# Patient Record
Sex: Male | Born: 1955 | Race: White | Hispanic: No | Marital: Married | Smoking: Former smoker
Health system: Southern US, Community
[De-identification: ages and names within clinical notes are randomized; demographics above are authoritative.]

## PROBLEM LIST (undated history)

## (undated) DIAGNOSIS — R972 Elevated prostate specific antigen [PSA]: Secondary | ICD-10-CM

## (undated) DIAGNOSIS — M1990 Unspecified osteoarthritis, unspecified site: Secondary | ICD-10-CM

## (undated) DIAGNOSIS — I10 Essential (primary) hypertension: Secondary | ICD-10-CM

## (undated) DIAGNOSIS — T8859XA Other complications of anesthesia, initial encounter: Secondary | ICD-10-CM

## (undated) DIAGNOSIS — T4145XA Adverse effect of unspecified anesthetic, initial encounter: Secondary | ICD-10-CM

## (undated) DIAGNOSIS — Q2112 Patent foramen ovale: Secondary | ICD-10-CM

## (undated) DIAGNOSIS — R011 Cardiac murmur, unspecified: Secondary | ICD-10-CM

## (undated) DIAGNOSIS — N400 Enlarged prostate without lower urinary tract symptoms: Secondary | ICD-10-CM

## (undated) DIAGNOSIS — M109 Gout, unspecified: Secondary | ICD-10-CM

## (undated) DIAGNOSIS — I253 Aneurysm of heart: Secondary | ICD-10-CM

## (undated) DIAGNOSIS — E669 Obesity, unspecified: Secondary | ICD-10-CM

## (undated) DIAGNOSIS — Q211 Atrial septal defect: Secondary | ICD-10-CM

## (undated) HISTORY — DX: Benign prostatic hyperplasia without lower urinary tract symptoms: R97.20

## (undated) HISTORY — PX: JOINT REPLACEMENT: SHX530

## (undated) HISTORY — DX: Essential (primary) hypertension: I10

## (undated) HISTORY — DX: Unspecified osteoarthritis, unspecified site: M19.90

---

## 2006-06-26 HISTORY — PX: TOTAL HIP ARTHROPLASTY: SHX124

## 2007-02-08 ENCOUNTER — Encounter (INDEPENDENT_AMBULATORY_CARE_PROVIDER_SITE_OTHER): Payer: Self-pay | Admitting: Cardiology

## 2007-02-08 ENCOUNTER — Ambulatory Visit (HOSPITAL_COMMUNITY): Admission: RE | Admit: 2007-02-08 | Discharge: 2007-02-08 | Payer: Self-pay | Admitting: Cardiology

## 2007-04-03 ENCOUNTER — Inpatient Hospital Stay (HOSPITAL_COMMUNITY): Admission: RE | Admit: 2007-04-03 | Discharge: 2007-04-07 | Payer: Self-pay | Admitting: Orthopedic Surgery

## 2007-05-15 ENCOUNTER — Encounter: Payer: Self-pay | Admitting: Orthopedic Surgery

## 2007-05-27 ENCOUNTER — Encounter: Payer: Self-pay | Admitting: Orthopedic Surgery

## 2008-06-26 HISTORY — PX: TOTAL HIP ARTHROPLASTY: SHX124

## 2008-08-31 ENCOUNTER — Inpatient Hospital Stay (HOSPITAL_COMMUNITY): Admission: RE | Admit: 2008-08-31 | Discharge: 2008-09-03 | Payer: Self-pay | Admitting: Orthopedic Surgery

## 2010-10-06 LAB — URINALYSIS, ROUTINE W REFLEX MICROSCOPIC
Bilirubin Urine: NEGATIVE
Glucose, UA: NEGATIVE mg/dL
Hgb urine dipstick: NEGATIVE
Ketones, ur: NEGATIVE mg/dL
Nitrite: NEGATIVE
Specific Gravity, Urine: 1.016 (ref 1.005–1.030)
Urobilinogen, UA: 0.2 mg/dL (ref 0.0–1.0)

## 2010-10-06 LAB — COMPREHENSIVE METABOLIC PANEL
ALT: 118 U/L — ABNORMAL HIGH (ref 0–53)
AST: 56 U/L — ABNORMAL HIGH (ref 0–37)
Albumin: 3.9 g/dL (ref 3.5–5.2)
BUN: 15 mg/dL (ref 6–23)
CO2: 27 mEq/L (ref 19–32)
Calcium: 9.6 mg/dL (ref 8.4–10.5)
Creatinine, Ser: 1.11 mg/dL (ref 0.4–1.5)
GFR calc Af Amer: >60 mL/min (ref >60)
GFR calc non Af Amer: >60 mL/min (ref >60)
Potassium: 3.9 mEq/L (ref 3.5–5.1)
Sodium: 138 mEq/L (ref 135–145)
Total Bilirubin: 0.8 mg/dL (ref 0.3–1.2)

## 2010-10-06 LAB — BASIC METABOLIC PANEL
CO2: 32 mEq/L (ref 19–32)
Calcium: 8.7 mg/dL (ref 8.4–10.5)
Chloride: 103 mEq/L (ref 96–112)
Chloride: 104 mEq/L (ref 96–112)
GFR calc Af Amer: >60 mL/min (ref >60)
GFR calc non Af Amer: >60 mL/min (ref >60)
Potassium: 4.8 mEq/L (ref 3.5–5.1)
Sodium: 137 mEq/L (ref 135–145)

## 2010-10-06 LAB — CBC
HCT: 37.2 % — ABNORMAL LOW (ref 39.0–52.0)
Hemoglobin: 12.5 g/dL — ABNORMAL LOW (ref 13.0–17.0)
Hemoglobin: 12.8 g/dL — ABNORMAL LOW (ref 13.0–17.0)
MCHC: 34.7 g/dL (ref 30.0–36.0)
MCV: 92.4 fL (ref 78.0–100.0)
MCV: 92.4 fL (ref 78.0–100.0)
Platelets: 122 10*3/uL — ABNORMAL LOW (ref 150–400)
Platelets: 124 10*3/uL — ABNORMAL LOW (ref 150–400)
RBC: 3.76 MIL/uL — ABNORMAL LOW (ref 4.22–5.81)
RBC: 4.87 MIL/uL (ref 4.22–5.81)
RDW: 13.1 % (ref 11.5–15.5)
RDW: 13.2 % (ref 11.5–15.5)
WBC: 7.7 10*3/uL (ref 4.0–10.5)

## 2010-10-06 LAB — TYPE AND SCREEN: Antibody Screen: NEGATIVE

## 2010-10-06 LAB — PROTIME-INR
INR: 1.6 — ABNORMAL HIGH (ref 0.00–1.49)
INR: 1.7 — ABNORMAL HIGH (ref 0.00–1.49)
Prothrombin Time: 15.3 seconds — ABNORMAL HIGH (ref 11.6–15.2)

## 2010-10-06 LAB — APTT: aPTT: 32 seconds (ref 24–37)

## 2010-11-08 NOTE — Op Note (Signed)
[name], Cunningham NO.:  [id number]   MEDICAL RECORD NO.:  [id number]          PATIENT TYPE:  INP   LOCATION:  1540                         FACILITY:  [facility]   PHYSICIAN:  Ollen Gross, M.D.    DATE OF BIRTH:  08/08/1955   DATE OF PROCEDURE:  04/03/2007  DATE OF DISCHARGE:                               OPERATIVE REPORT   PREOPERATIVE DIAGNOSIS:  Osteoarthritis left hip.   POSTOPERATIVE DIAGNOSIS:  Osteoarthritis left hip.   PROCEDURE:  Left total hip arthroplasty.   SURGEON:  Ollen Gross, M.D.   ASSISTANT:  Avel Peace PA-C   ANESTHESIA:  General.   ESTIMATED BLOOD LOSS:  500 mL.   DRAINS:  None.   COMPLICATIONS:  None.   CONDITION:  Stable to recovery.   BRIEF CLINICAL NOTE:  [name] is a 55 year old male with severe end-  stage arthritis of the left hip with progressively worsening pain and  dysfunction.  He has failed nonoperative management and presents now for  left total hip arthroplasty.   PROCEDURE IN DETAIL:  After successful administration of general  anesthetic, the patient is placed in the right lateral decubitus  position with the left side up and held with the hip positioner.  Left  lower extremity isolated from his perineum with plastic drapes and  prepped and draped in usual sterile fashion.  Short posterolateral  incision is made with a 10 blade through subcutaneous tissue to the  level of the fascia lata which was incised in line with the skin  incision.  The sciatic nerve was palpated and protected and short  external rotators isolated off the femur.  Capsulectomy is performed and  the hip was dislocated.  The center of femoral head is marked and a  trial prosthesis placed such that such that the center of the trial head  corresponds to the center of his native femoral head.  Osteotomy line  was  marked on the femoral neck and osteotomy was made with an  oscillating saw.  Femoral head removed and then femur was  retracted  anteriorly to gain acetabular exposure.   The acetabular retractors were placed and labrum and osteophytes  removed.  Reaming starts at 45 mm coursing increments of 2 to 55 and we  placed a 56 mm pinnacle acetabular shell in anatomic position and  transfixed with two dome screws with excellent purchase.  The apex hole  eliminator is placed and then the 40 mm neutral Ultamet metal liner is  placed for metal-on-metal hip replacement.   The femur was prepared with canal finder and irrigation.  Axial reaming  is performed to 13.5 mm and proximal reaming to 18D and the sleeve  machined to a large.  A 1 D large trial sleeve is placed with 18 x 13  stem and 36 plus 8 neck.  His native anteversion was neutral so I  anteverted the stem 20 degrees.  The 40.0 trial head is placed and the  hip is reduced with great stability.  There was full extension, full  external rotation, 70 degrees flexion, 40 degrees abduction and  90  degrees internal rotation and 90 degrees of flexion and 70 degrees of  internal rotation.  By placing the left leg on top of the right, it felt  as though leg lengths were equal.  The hip was then dislocated and  trials are removed.  The permanent 18D large sleeve is placed with 18 x  13 stem, 36 plus 8 neck 20 degrees beyond native anteversion.  The 40  plus 0 head is placed and the hip is reduced with the same stability  parameters.  Wound was copiously irrigated with saline solution and  short rotators reattached to the femur through drill holes.  Fascia lata  was closed with interrupted #1 Vicryl, subcu closed with #1-0 and #2-0  Vicryl and subcuticular running 4-0 Monocryl.  Incisions cleaned and  dried and Steri-Strips and bulky sterile dressing applied.  The left  lower extremity was placed into knee immobilizer.  He was awakened and  transferred to recovery in stable condition.  April 03, 2007      Ollen Gross, M.D.  Electronically Signed      FA/MEDQ  D:  04/03/2007  T:  04/04/2007  Job:  [id number]

## 2010-11-08 NOTE — Discharge Summary (Signed)
[name], [name] NO.:  [id number]   MEDICAL RECORD NO.:  [id number]          PATIENT TYPE:  INP   LOCATION:  1605                         FACILITY:  [facility]   PHYSICIAN:  [name], M.D.    DATE OF BIRTH:  1955-08-08   DATE OF ADMISSION:  08/31/2008  DATE OF DISCHARGE:  09/03/2008                               DISCHARGE SUMMARY   ADMITTING DIAGNOSES:  1. Osteoarthritis, right hip.  2. Hypercholesterolemia.  3. Past history of renal insufficiency secondary to nonsteroidal anti-      inflammatory drugs.  4. Past history of elevated liver function tests secondary to      nonsteroidal anti-inflammatory drugs.  5. History of gout.   DISCHARGE DIAGNOSES:  1. Osteoarthritis, right hip, status post right total replacement      arthroplasty.  2. Hypercholesterolemia.  3. Past history of renal insufficiency secondary to nonsteroidal anti-      inflammatory drugs.  4. Past history of elevated liver function tests secondary to      nonsteroidal anti-inflammatory drugs.  5. History of gout.   PROCEDURE:  August 31, 2008 - right total hip.  Surgeon - Dr. Lequita Cunningham.  Assistant - James Alar, PA-C.  Surgery done under general anesthesia.   CONSULTS:  [facility].   BRIEF HISTORY:  [name] is a 55 year old male with end-stage  arthritis of the right hip, progressive worsening pain and dysfunction,  recent left total hip who now presents for a right total hip.   LABORATORY DATA:  Preoperative CBC showed a hemoglobin of 15.5,  hematocrit of 44.3, white cell count 5.3, platelets 133.  PT/INR 13 and  1 respectively with a PTT of 32.  Chem panel within normal limits with  the exception of mildly elevated SGOT of 56 and elevated SGPT of 118.  Alkaline phosphatase was  normal at 78.  A known prior history of  elevated liver function tests.  Preoperative UA was negative.  Serial  CBCs were followed throughout the hospital course.  Hemoglobin  dropped  down to 12.8 and then 12.5.  The last noted hemoglobin and hematocrit  were 12.2 and 34.5.  Serial pro times followed per Coumadin protocol.  Last noted PT/INR of 21.3 and 1.7.  Serial BMETs were followed.  Follow  up BMETs all stayed within normal limits.   X-RAYS:  Right hip film on August 25, 2008.  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   HOSPITAL COURSE:  The patient was admitted to [facility] and  taken to the OR.  Underwent the above-stated procedure without  complications.  The patient tolerated the procedure well and was later  transferred to the recovery room and orthopedic floor.  Given 24 hours  of postoperative IV antibiotics.  Started on PCA and p.o. analgesic pain  control following surgery.  Was seen postoperatively by [facility].  His Zocor was put on hold because  of the elevated liver function test.  He was given Coumadin for DVT  prophylaxis.  He was doing well  on the evening of surgery.   On the morning of day one, he started to have therapy with partial  weightbearing of 25-50%.  Hemoglobin stable.  He had a history of renal  insufficiency, but his BUN and creatinine were normal preoperatively,  and also looked good postoperatively and remained stable.  He had  excellent urinary output.  He started getting up out of bed on day one  and did very well.  He walked over 200 feet.   By day two, he was doing well.  Pain was under good control.  Dressing  changed; incision looked good.  Hemoglobin was stable.  He was  tolerating his medications.   He continued to progress well, and by the morning of day three, he was  seen on rounds by Dr. Lequita Cunningham, tolerating his medications, meeting his  goals and was discharged home.   DISCHARGE/PLAN:  1. The patient was discharged home on September 03, 2008.  2. Discharge diagnoses - please see above.  3. Discharge medications - he is given Percocet,  Robaxin and Coumadin.   FOLLOWUP:  Follow up in 2 weeks.  Follow up on either Tuesday, September 15, 2008 or Thursday, September 17, 2008.  Call for an appointment.  Diet - low  cholesterol diet.   ACTIVITY:  He is partial weightbearing 25-50%.  Hip precautions, total  hip protocol.  Home health PT and home health nursing.   DISPOSITION:  Home.   CONDITION ON DISCHARGE:  Improving.      [name], P.A.C.      [name], M.D.  Electronically Signed    ALP/MEDQ  D:  09/03/2008  T:  09/03/2008  Job:  [id number]   cc:   [name], M.D.  Fax: [phone]

## 2010-11-08 NOTE — Op Note (Signed)
NAMEMACEN, JOSLIN NO.:  [id number]   MEDICAL RECORD NO.:  [id number]          PATIENT TYPE:  INP   LOCATION:  0001                         FACILITY:  [facility]   PHYSICIAN:  Ollen Gross, M.D.    DATE OF BIRTH:  [birth year]   DATE OF PROCEDURE:  08/31/2008  DATE OF DISCHARGE:                               OPERATIVE REPORT   PREOPERATIVE DIAGNOSIS:  Osteoarthritis right hip.   POSTOPERATIVE DIAGNOSIS:  Osteoarthritis right hip.   PROCEDURE:  Right total hip arthroplasty.   SURGEON:  Ollen Gross, M.D.   ASSISTANT:  Arlyn Leak, PA-C   ANESTHESIA:  General.   ESTIMATED BLOOD LOSS:  200 mL.   DRAINS:  Hemovac x1.   COMPLICATIONS:  None.   CONDITION:  Stable to recovery.   CLINICAL NOTE:  Mr. Capell is a 55 year old male with end-stage  arthritis of the right hip with progressively worsening pain and  dysfunction.  He had a recent successful left total hip arthroplasty  presents now for right total hip arthroplasty.   PROCEDURE IN DETAIL:  After successful administration of general  anesthetic, the patient was placed in left lateral decubitus position  with the right side up and held with the hip positioner.  Right lower  extremity was isolated from his perineum with plastic drapes and prepped  and draped in the usual sterile fashion.  Short posterolateral incision  is made with a 10 blade through subcutaneous tissue to the level of  fascia lata was incised in line with the skin incision.  Sciatic nerve  was palpated and protected and short external rotators isolated off the  femur.  Capsulectomy is performed and the hip was dislocated.  Center of  femoral head is marked and a trial prosthesis was placed such that the  center of the trial head corresponds to the center of his native femoral  head.  Osteotomy line is marked on the femoral neck and osteotomy made  with an oscillating saw.  The femoral head is removed and femur was  retracted anteriorly  to gain acetabular exposure.   Acetabular retractors were placed in the labrum and osteophytes removed.  Acetabular reaming begins at 47 mm coursing in increments of 2 to 55 mm  and a 56 mm Pinnacle acetabular shell is placed in anatomic position and  transfixed with 2 dome screws.  The apex hole eliminator was then placed  and a 40 mm neutral Ultamet metal liner was placed for the metal-on-  metal hip replacement.   The femur was repaired with canal finder and irrigation.  Axial reaming  is performed to 13.5 mm, proximal reaming to an 18 D and the sleeve  machined to a small.  A 18 D small sleeve is placed with 80 x 13 stem  and 36 +8 neck matching native anteversion.  The 40.0 head is placed and  it reduces too easily so I went to 40 +3 which had much better soft  tissue tension.  There is fantastic stability with full extension, full  external rotation, 70 degrees flexion, 40 degrees adduction and 90  degrees  internal rotation, 90 degrees of flexion and 70 degrees of  internal rotation.  By placing the right leg on top of the left, I felt  as though the leg lengths were equal.  The hip was then dislocated and  trials removed.  The permanent 18 D small sleeve is placed and 18 x 13  stem and 36 +8 matching native anteversion.  Permanent 40 +3 head is  placed and the hip is reduced in the same stability parameters.  Wounds  copiously irrigated with saline solution.  Short rotators reattached to  the femur through drill holes.  Fascia lata was closed over Hemovac  drain with interrupted #1 Vicryl.  Subcu closed with #1-0 and #2-0  Vicryl and subcuticular running 4-0 Monocryl.  Incisions cleaned and  dried and Steri-Strips and bulky sterile dressing applied.  Drains  hooked to suction and he is placed into a knee immobilizer, awakened and  transferred to recovery in stable condition.      Ollen Gross, M.D.  Electronically Signed     FA/MEDQ  D:  08/31/2008  T:  08/31/2008  Job:   [id number]

## 2010-11-08 NOTE — H&P (Signed)
[name], KUSTRA NO.:  [id number]   MEDICAL RECORD NO.:  [id number]          PATIENT TYPE:  INP   LOCATION:  NA                           FACILITY:  [facility]   PHYSICIAN:  Ollen Gross, M.D.    DATE OF BIRTH:  1955-09-01   DATE OF ADMISSION:  08/31/2008  DATE OF DISCHARGE:                              HISTORY & PHYSICAL   DATE OF OFFICE VISIT HISTORY AND PHYSICAL:  August 18, 2008.   CHIEF COMPLAINT:  Right hip pain.   HISTORY OF PRESENT ILLNESS:  The patient is a 56 year old male who gas  been seen by Dr. Lequita Halt, well known to him, previously had undergone a  left total hip back in 2008.  Unfortunately, the right hip continues to  be progressively getting worse.  He is known to have severe end-stage  arthritis in that right hip, bone-on-bone.  It is felt he would benefit  undergoing surgical intervention.  Risks and benefits have been  discussed, he elects to proceed with surgery.   ALLERGIES:  No known drug allergies.   CURRENT MEDICATIONS:  Simvastatin, low-dose aspirin, multivitamin,  Vitamin D, hydrocodone.   PAST MEDICAL HISTORY:  1. Hypercholesterolemia.  2. Past history of renal insufficiency secondary to NSAIDs.  3. Past history of elevated liver function test secondary to NSAIDs.  4. History of gout.   PAST SURGICAL HISTORY:  Left total hip 2008.   FAMILY HISTORY:  Father deceased age 59 with lung cancer.  Mother  living, age 69, with Alzheimer's.  Sister deceased, age 45, secondary  breast cancer.   SOCIAL HISTORY:  Married, works as a Geneticist, molecular.  Past smoker  about a half-pack per day for 20 years, no alcohol.  One child.  Lives  with wife.  Does have a caregiver lined up.  Has 6 steps entering his  home.   REVIEW OF SYSTEMS:  GENERAL:  No fevers, chills or night sweats.  NEURO:  No seizures, syncope, paralysis.  RESPIRATORY:  No shortness of breath,  productive cough, hemoptysis.  CARDIOVASCULAR:  No chest pain, angina,  orthopnea.  GI: No nausea, vomiting, diarrhea or constipation.  GU:  No  dysuria or hematuria.  MUSCULOSKELETAL:  Hip pain right hip.   PHYSICAL EXAMINATION:  VITAL SIGNS:  Pulse 72, respirations 14, blood  pressure 136/88.  GENERAL:  A 55 year old white male, well-nourished, well-developed, no  acute distress, alert, oriented, cooperative.  HEENT:  Normocephalic, atraumatic.  Pupils are round, reactive.  EOMs  intact.  NECK:  Supple.  CHEST:  Clear.  HEART:  Regular rate and rhythm.  No murmur.  S1-S2 noted.  ABDOMEN:  Soft, nontender.  Bowel sounds present.  Slightly round.  RECTAL/BREASTS/GENITALIA:  Not done, not pertinent to present illness.  EXTREMITIES:  Right hip flexion 90, zero internal rotation, zero  external rotation, about 20 degrees of abduction.   IMPRESSION:  Osteoarthritis right hip.   PLAN:  The patient admitted to [facility].  Will undergo a  right total hip.  The surgery will be performed by Ollen Gross.      Alexzandrew L.  Perkins, P.A.C.      Ollen Gross, M.D.  Electronically Signed    ALP/MEDQ  D:  08/27/2008  T:  08/27/2008  Job:  [id number]   cc:   Brett Canales A. Cleta Alberts, M.D.  Fax: [phone]   Ollen Gross, M.D.  Fax: [phone]

## 2010-11-11 NOTE — H&P (Signed)
[name], [name] NO.:  [id number]   MEDICAL RECORD NO.:  [id number]          PATIENT TYPE:  INP   LOCATION:  1540                         FACILITY:  [facility]   PHYSICIAN:  Ollen Gross, M.D.    DATE OF BIRTH:  1956/06/07   DATE OF ADMISSION:  04/03/2007  DATE OF DISCHARGE:  04/07/2007                              HISTORY & PHYSICAL   CHIEF COMPLAINT:  Left hip pain.   HISTORY OF PRESENT ILLNESS:  The patient is a 55 year old male who has  been seen by Dr. Lequita Halt for ongoing left hip pain.  He was noted to  have end-stage arthritis of both hips but continues to have discomfort.  The left is more problematic. Pain is getting much worse and has reached  the point where it is felt he would benefit undergoing surgical  intervention.  Risks and benefits have been discussed.  He elects to  proceed with surgery.   ALLERGIES:  No known drug allergies.   CURRENT MEDICATIONS:  Tricor, Crestor, Vicodin, aspirin, Centrum  vitamin.   PAST MEDICAL HISTORY:  1. Gout.  2. Hypercholesterolemia.  3. History of renal insufficiency.  4. History of elevated liver enzymes.  5. Right bundle branch block per EKG.   PAST SURGICAL HISTORY:  Is negative.   SOCIAL HISTORY:  Married.  He works as a Geneticist, molecular. Nonsmoker,  occasional intake of alcohol.  One child.  Wife will be assisting with  care after surgery.   FAMILY HISTORY:  Father with history of hypertension, heart disease and  a pacemaker. Grandfather with history of stroke, uncle with colon  cancer, sister with breast cancer.   REVIEW OF SYSTEMS:  GENERAL:  No fevers, chills, night sweats.  NEUROLOGIC: Seizures, syncope, or paralysis.  RESPIRATORY:  No shortness  of breath, productive cough or hemoptysis.  CARDIOVASCULAR:  No chest  pain, angina, orthopnea.  GI:  No nausea, diarrhea or constipation.  GU:  No dysuria, hematuria or discharge.  MUSCULOSKELETAL: Left hip.   PHYSICAL EXAMINATION:  VITAL SIGNS:   Pulse 64, respirations 12, blood  pressure 118/82.  GENERAL:  A 55 year old white male, well-nourished, well-developed, no  acute distress, alert, oriented, and cooperative.  Good historian.  Mildly anxious.  HEENT:  Normocephalic, atraumatic.  Pupils are round and reactive.  Oropharynx clear.  EOMs intact.  NECK:  Supple.  CHEST:  Clear.  HEART:  Regular rate and rhythm.  No murmur. S1-S2 noted.  ABDOMEN:  Soft, nontender.  Bowel sounds present.  RECTAL, BREASTS, GENITALIA:  Not done, not pertinent to present illness.  EXTREMITIES:  Left hip: Flexion 90-0, internal rotation 20 degrees,  external rotation 20 degrees abduction.   IMPRESSION:  1. Osteoarthritis, left hip.  2. History of gout.  3. Hypercholesterolemia.  4. History of renal insufficiency.  5. History of elevated liver enzymes.  6. Right bundle branch block per electrocardiogram.   PLAN:  The patient will be admitted to [facility] and undergo  a left total hip replacement arthroplasty.  Surgery will be performed by  Dr. Ollen Gross.      Alexzandrew  Tessie Fass, P.A.C.      Ollen Gross, M.D.  Electronically Signed    ALP/MEDQ  D:  04/16/2007  T:  04/16/2007  Job:  [id number]

## 2010-11-11 NOTE — Discharge Summary (Signed)
[name], [name]               ACCOUNT NO.:  [id number]   MEDICAL RECORD NO.:  [id number]          PATIENT TYPE:  INP   LOCATION:  1540                         [facility]ACILITY:  [facility]   PHYSICIAN:  Ollen Gross, M.D.    DATE OF BIRTH:  10-Oct-1955   DATE OF ADMISSION:  04/03/2007  DATE OF DISCHARGE:  04/07/2007                               DISCHARGE SUMMARY   ADMISSION DIAGNOSES:  1. Osteoarthritis left hip.  2. History of gout.  3. Hypercholesterolemia.  4. History of renal insufficiency.  5. History of elevated liver enzymes.  6. Right bundle branch block per electrocardiogram.   DISCHARGE DIAGNOSES:  1. Osteoarthritis left hip, status post left total hip arthroplasty.  2. Mild postop blood loss anemia.  3. History of gout.  4. Hypercholesterolemia.  5. History of renal insufficiency.  6. History of elevated liver enzymes  7. Right bundle branch block per electrocardiogram.   PROCEDURE:  April 03, 2007:  Left total hip.  Surgeon:  Dr. Lequita Halt  assisted by [name].   CONSULTATIONS:  None.   BRIEF HISTORY:  [name] is a 55 year old male with end-stage  arthritis of the left hip, progressive worsening pain and dysfunction,  had been on pain management, but  now presents for total hip.   LABORATORY DATA:  Preop CBC showed hemoglobin 13.7, hematocrit 39.0,  white cell count 4.5.  Postop hemoglobin 11.5 drifting down to 10.7.  Last H&H 10.8 and 30.3.  PT/PTT preop 13.3 and 31 respectively.  INR  1.0.  Serial pro-times followed.  PT/INR 23.2 and 2.0.  Chem panel on  admission all within normal limits.  Serial BMETs were followed.  Electrolytes remained within normal limits in the postop period.  Preop  UA negative.  Blood group type O+.   DIAGNOSTICS:  1. Left hip films preop March 28, 2007:  Advanced bilateral      degenerative changes both hips, more so on the left.  2. Portable pelvis/hip films:  Left hip arthroplasty without immediate      complicating  features.   HOSPITAL COURSE:  The patient was admitted to [facility] and  tolerated the procedure well, later transferred to the recovery room on  the orthopedic floor.  Started on PCA and p.o. analgesic for pain  control following surgery.  Given 24 hours postop IV antibiotics.  Initially doing pretty well.  On morning of day 1, he did have some  increased pain on the evening of surgery.  He was doing a little better  on day 1.  Started getting up out of bed. We discontinued the knee  immobilizer.  By day 2, he was still a little sore, but pain was under a  little bit better control.  Hemoglobin was down to 10.7.  He was  asymptomatic with this.  Dressing was changed.  Incision looked good.  Started getting up with therapy.  He was partial weightbearing by day 3.  He had a little bit elevated temperature on the evening day 2 which was  better by day 3.  He started getting up doing more mobility,  but not  quite completely independent yet.  Therefore, he was kept another day  for therapy until April 07, 2007.  He was doing well, tolerating meds  and discharged home.   DISCHARGE/PLAN:  The patient is discharged home on April 07, 2007.   DISCHARGE MEDICATIONS:  1. Vicodin.  2. Robaxin.  3. Coumadin.   DIET:  As tolerated.   FOLLOW UP:  Follow up in 2 weeks.   ACTIVITY:  Partial weightbearing left lower extremity 25-50%.  Home  health PT and home health nursing, hip precautions total hip protocol.   DISPOSITION:  Home.   CONDITION ON DISCHARGE:  Improving.      [name], P.A.C.      Ollen Gross, M.D.  Electronically Signed    ALP/MEDQ  D:  05/22/2007  T:  05/22/2007  Job:  [id number]   cc:   Ollen Gross, M.D.  Fax: [phone]

## 2011-04-06 LAB — ABO/RH: ABO/RH(D): O POS

## 2011-04-06 LAB — BASIC METABOLIC PANEL
BUN: 10
BUN: 7
CO2: 29
CO2: 30
Calcium: 8.7
Chloride: 106
Creatinine, Ser: 1.43
GFR calc Af Amer: >60
GFR calc non Af Amer: 59 — ABNORMAL LOW
Glucose, Bld: 127 — ABNORMAL HIGH
Glucose, Bld: 130 — ABNORMAL HIGH
Potassium: 4
Sodium: 137

## 2011-04-06 LAB — CBC
HCT: 32.3 — ABNORMAL LOW
HCT: 39
Hemoglobin: 10.8 — ABNORMAL LOW
Hemoglobin: 11.5 — ABNORMAL LOW
MCHC: 35.1
MCHC: 35.6
MCHC: 35.7
MCHC: 35.7
MCV: 91.4
Platelets: 149 — ABNORMAL LOW
Platelets: 156
Platelets: 167
RBC: 4.25
RDW: 11.9
RDW: 12.1
RDW: 12.3
RDW: 12.4
WBC: 4.5

## 2011-04-06 LAB — COMPREHENSIVE METABOLIC PANEL
ALT: 37
CO2: 25
Calcium: 9.4
Chloride: 104
Creatinine, Ser: 1.41
GFR calc Af Amer: >60
Glucose, Bld: 101 — ABNORMAL HIGH
Potassium: 3.5
Sodium: 137
Total Bilirubin: 0.9

## 2011-04-06 LAB — PROTIME-INR
INR: 1
INR: 1.6 — ABNORMAL HIGH
INR: 1.9 — ABNORMAL HIGH
Prothrombin Time: 13.3
Prothrombin Time: 19.6 — ABNORMAL HIGH
Prothrombin Time: 23.2 — ABNORMAL HIGH

## 2011-04-06 LAB — URINALYSIS, ROUTINE W REFLEX MICROSCOPIC
Glucose, UA: NEGATIVE
Urobilinogen, UA: 0.2
pH: 5.5

## 2011-04-06 LAB — CROSSMATCH: ABO/RH(D): O POS

## 2011-08-03 ENCOUNTER — Ambulatory Visit: Payer: Self-pay | Admitting: General Practice

## 2011-08-30 ENCOUNTER — Telehealth: Payer: Self-pay

## 2011-08-30 NOTE — Telephone Encounter (Signed)
WIFE NOTIFIED AND RX PHONED INTO [facility]

## 2011-08-30 NOTE — Telephone Encounter (Signed)
Ok to call in norco 325/5 1 q4 for pain #30 1 refill

## 2011-08-30 NOTE — Telephone Encounter (Signed)
Dr Cleta Alberts, pts chart is in your box

## 2011-08-30 NOTE — Telephone Encounter (Signed)
PATIENT IS NEEDING HIS VICODIN REFILLED.  ACCORDING TO HIS WIFE, THE PHARMACY SAID IT WAS DENIED.  SHE WOULD LIKE FOR DOCTOR DAUB TO LOOK AT THE CHART AND PLEASE REFILL THIS.

## 2011-10-17 ENCOUNTER — Encounter: Payer: Self-pay | Admitting: Emergency Medicine

## 2011-10-17 ENCOUNTER — Ambulatory Visit (INDEPENDENT_AMBULATORY_CARE_PROVIDER_SITE_OTHER): Payer: PRIVATE HEALTH INSURANCE | Admitting: Emergency Medicine

## 2011-10-17 VITALS — BP 140/89 | HR 57 | Temp 97.7°F | Resp 16 | Ht 68.0 in | Wt 214.6 lb

## 2011-10-17 DIAGNOSIS — Z0000 Encounter for general adult medical examination without abnormal findings: Secondary | ICD-10-CM

## 2011-10-17 DIAGNOSIS — N529 Male erectile dysfunction, unspecified: Secondary | ICD-10-CM

## 2011-10-17 DIAGNOSIS — M25559 Pain in unspecified hip: Secondary | ICD-10-CM

## 2011-10-17 LAB — LIPID PANEL
Cholesterol: 175 mg/dL (ref 0–200)
HDL: 32 mg/dL — ABNORMAL LOW (ref >39)
Total CHOL/HDL Ratio: 5.5 Ratio
VLDL: 34 mg/dL (ref 0–40)

## 2011-10-17 LAB — CBC WITH DIFFERENTIAL/PLATELET
Basophils Relative: 1 % (ref 0–1)
Eosinophils Absolute: 0.2 10*3/uL (ref 0.0–0.7)
Eosinophils Relative: 3 % (ref 0–5)
Lymphs Abs: 1.8 10*3/uL (ref 0.7–4.0)
MCH: 30.8 pg (ref 26.0–34.0)
MCHC: 35 g/dL (ref 30.0–36.0)
MCV: 88 fL (ref 78.0–100.0)
Monocytes Relative: 8 % (ref 3–12)
Neutrophils Relative %: 60 % (ref 43–77)
Platelets: 166 10*3/uL (ref 150–400)
RBC: 5.09 MIL/uL (ref 4.22–5.81)

## 2011-10-17 LAB — POCT URINALYSIS DIPSTICK
Leukocytes, UA: NEGATIVE
Nitrite, UA: NEGATIVE
Protein, UA: NEGATIVE
Urobilinogen, UA: 0.2
pH, UA: 5.5

## 2011-10-17 LAB — POCT UA - MICROSCOPIC ONLY
Casts, Ur, LPF, POC: NEGATIVE
Crystals, Ur, HPF, POC: NEGATIVE
Yeast, UA: NEGATIVE

## 2011-10-17 LAB — COMPREHENSIVE METABOLIC PANEL
Alkaline Phosphatase: 75 U/L (ref 39–117)
CO2: 25 mEq/L (ref 19–32)
Creat: 1.19 mg/dL (ref 0.50–1.35)
Glucose, Bld: 84 mg/dL (ref 70–99)
Total Bilirubin: 0.6 mg/dL (ref 0.3–1.2)

## 2011-10-17 LAB — TSH: TSH: 0.95 u[IU]/mL (ref 0.350–4.500)

## 2011-10-17 NOTE — Progress Notes (Signed)
  Subjective:    Patient ID: [name], male    DOB: 04/28/1956, 56 y.o.   MRN: [id number]  HPI patient here for yearly physical exam. He is doing very well recently. He now has a job at [employer] in the [employer]. He is hesitant to take medications. He rarely takes a Vicodin at night for pain he occasionally takes Viagra for his ADD. He would rather work on his blood pressure problem list diet and exercise    Review of Systems  Constitutional: Negative.   HENT: Negative.   Eyes: Negative.   Respiratory: Negative.   Cardiovascular: Negative.   Gastrointestinal: Negative.   Genitourinary:       He does have some issues with ED but only takes about a quarter tablet when he takes medication.  Musculoskeletal:       He's done very well since his hip replacement and is very happy proceed with that  Skin: Negative.   Neurological: Negative.   Hematological: Negative.   Psychiatric/Behavioral: Negative.        Objective:   Physical Exam  Constitutional: He appears well-developed.  HENT:  Head: Normocephalic and atraumatic.  Eyes: Pupils are equal, round, and reactive to light.  Neck: No tracheal deviation present. No thyromegaly present.  Cardiovascular: Normal rate, regular rhythm, normal heart sounds and intact distal pulses.  Exam reveals no gallop and no friction rub.   No murmur heard. Pulmonary/Chest: No respiratory distress. He has no wheezes. He has no rales. He exhibits no tenderness.  Abdominal: He exhibits no distension. There is no tenderness. There is no rebound and no guarding.  Genitourinary: Prostate normal.  Musculoskeletal: Normal range of motion.  Neurological: He is alert. No cranial nerve deficit. Coordination normal.  Skin: Skin is warm and dry.  Psychiatric: He has a normal mood and affect. His behavior is normal.    EKG RBBB      Assessment & Plan:  The blood pressure is borderline elevated at 140/89. He would prefer to treat  this with Salt restriction and increased exercise.

## 2011-10-18 ENCOUNTER — Encounter: Payer: Self-pay | Admitting: Family Medicine

## 2012-01-16 ENCOUNTER — Telehealth: Payer: Self-pay

## 2012-01-16 NOTE — Telephone Encounter (Signed)
MELODY STATES HER HUSBAND IS HAVING A ROUGH TIME GETTING HIS VICODIN REFILLED, THE PHARMACY HAVE BEEN TRYING FOR SEVERAL DAYS NOW PLEASE CALL [phone] OR [phone]     [facility] [address] IN [place]

## 2012-01-17 MED ORDER — HYDROCODONE-ACETAMINOPHEN 5-325 MG PO TABS: ORAL_TABLET | ORAL | Status: DC

## 2012-01-17 NOTE — Telephone Encounter (Signed)
Dr Cleta Alberts, pt was in to see you in April, but no record of Vicodin Rx in [facility]. I have put pts chart [id number] by your computer w/pg marked from last request received.

## 2012-01-17 NOTE — Telephone Encounter (Signed)
Per Dr Cleta Alberts, OK to give pt 30 day plus 1 add'l RF. Ordered verbal w/readback and called in Rx to pharm. Notified wife

## 2012-04-02 ENCOUNTER — Other Ambulatory Visit: Payer: Self-pay | Admitting: Emergency Medicine

## 2012-04-02 NOTE — Telephone Encounter (Signed)
Pt is needing a rx refill on viagra, he is upset the pharmacy states they have called several times to get authorization on this medication and pt states the pharmacy is still waiting. Please contact pt and advise. [phone]

## 2012-04-02 NOTE — Telephone Encounter (Signed)
[facility] [address] Patient is ANGRY he states he needs his Viagra renewed, please advise if we can send this in for him, he is raising his voice over the phone, and states if he does not get this renewed he will not come back here. I advised I will check and see if refill is appropriate, if it is [facility], if not, I will call him back.

## 2012-04-04 ENCOUNTER — Telehealth: Payer: Self-pay | Admitting: *Deleted

## 2012-04-04 NOTE — Telephone Encounter (Signed)
Littrell, Amy Wall, RT 04/02/2012 5:03 PM Signed  [facility] [address] Patient is ANGRY he states he needs his Viagra renewed, please advise if we can send this in for him, he is raising his voice over the phone, and states if he does not get this renewed he will not come back here. I advised I will check and see if refill is appropriate, if it is we [facility], if not, I will call him back.  Lauretta Grill 04/02/2012 11:54 AM Signed  Pt is needing a rx refill on viagra, he is upset the pharmacy states they have called several times to get authorization on this medication and pt states the pharmacy is still waiting. Please contact pt and advise. [phone]

## 2012-04-05 NOTE — Telephone Encounter (Signed)
Dr Cleta Alberts filled this on the 8th of October.

## 2012-05-20 ENCOUNTER — Other Ambulatory Visit: Payer: Self-pay | Admitting: Emergency Medicine

## 2012-10-30 ENCOUNTER — Other Ambulatory Visit: Payer: Self-pay | Admitting: Emergency Medicine

## 2012-10-31 NOTE — Telephone Encounter (Signed)
Dr Cleta Alberts, pt has appt scheduled w/you on 11/05/12 for CPE.

## 2012-11-05 ENCOUNTER — Ambulatory Visit (INDEPENDENT_AMBULATORY_CARE_PROVIDER_SITE_OTHER): Payer: 59 | Admitting: Emergency Medicine

## 2012-11-05 ENCOUNTER — Encounter: Payer: Self-pay | Admitting: Emergency Medicine

## 2012-11-05 ENCOUNTER — Telehealth: Payer: Self-pay | Admitting: Family Medicine

## 2012-11-05 VITALS — BP 150/90 | HR 60 | Temp 97.7°F | Resp 16 | Ht 68.0 in | Wt 218.2 lb

## 2012-11-05 DIAGNOSIS — Z0000 Encounter for general adult medical examination without abnormal findings: Secondary | ICD-10-CM

## 2012-11-05 DIAGNOSIS — L989 Disorder of the skin and subcutaneous tissue, unspecified: Secondary | ICD-10-CM

## 2012-11-05 DIAGNOSIS — M25559 Pain in unspecified hip: Secondary | ICD-10-CM

## 2012-11-05 DIAGNOSIS — M1990 Unspecified osteoarthritis, unspecified site: Secondary | ICD-10-CM | POA: Insufficient documentation

## 2012-11-05 DIAGNOSIS — M1909 Primary osteoarthritis, other specified site: Secondary | ICD-10-CM

## 2012-11-05 DIAGNOSIS — I10 Essential (primary) hypertension: Secondary | ICD-10-CM | POA: Insufficient documentation

## 2012-11-05 DIAGNOSIS — G8929 Other chronic pain: Secondary | ICD-10-CM

## 2012-11-05 DIAGNOSIS — N529 Male erectile dysfunction, unspecified: Secondary | ICD-10-CM

## 2012-11-05 LAB — CBC WITH DIFFERENTIAL/PLATELET
Basophils Absolute: 0 10*3/uL (ref 0.0–0.1)
Basophils Relative: 1 % (ref 0–1)
HCT: 45.5 % (ref 39.0–52.0)
MCHC: 34.7 g/dL (ref 30.0–36.0)
Monocytes Absolute: 0.5 10*3/uL (ref 0.1–1.0)
Neutro Abs: 3.6 10*3/uL (ref 1.7–7.7)
Neutrophils Relative %: 57 % (ref 43–77)
Platelets: 168 10*3/uL (ref 150–400)
RDW: 14 % (ref 11.5–15.5)

## 2012-11-05 LAB — POCT URINALYSIS DIPSTICK
Blood, UA: NEGATIVE
Nitrite, UA: NEGATIVE
Protein, UA: NEGATIVE
Urobilinogen, UA: 0.2
pH, UA: 6

## 2012-11-05 MED ORDER — SILDENAFIL CITRATE 100 MG PO TABS: ORAL_TABLET | ORAL | Status: DC

## 2012-11-05 MED ORDER — HYDROCODONE-ACETAMINOPHEN 5-325 MG PO TABS: ORAL_TABLET | ORAL | Status: DC

## 2012-11-05 MED ORDER — LOSARTAN POTASSIUM 25 MG PO TABS: 25.0000 mg | ORAL_TABLET | Freq: Every day | ORAL | Status: DC

## 2012-11-05 NOTE — Patient Instructions (Signed)

## 2012-11-05 NOTE — Addendum Note (Signed)
Addended by: Mervin Kung on: 11/05/2012 04:58 PM   Modules accepted: Orders

## 2012-11-05 NOTE — Progress Notes (Signed)
  Subjective:    Patient ID: [name], male    DOB: 1956-06-16, 57 y.o.   MRN: [id number]  HPI    Review of Systems  Constitutional: Negative.   HENT: Negative.   Eyes: Negative.   Respiratory: Negative.   Cardiovascular: Negative.   Gastrointestinal: Negative.   Endocrine: Negative.   Genitourinary: Negative.   Musculoskeletal: Positive for arthralgias.  Skin: Negative.   Allergic/Immunologic: Negative.   Neurological: Negative.   Hematological: Negative.   Psychiatric/Behavioral: Negative.        Objective:   Physical Exam HEENT exam is unremarkable. Neck is supple. Chest is clear to auscultation and percussion cardiac reveals a regular rate without murmurs rubs or gallops. Abdomen is soft liver spleen not enlarged. Rectal exam reveals a smooth symmetrical prostate which is nontender. There is a half by half centimeter seborrheic keratosis of the right forearm and a similar lesion on the right calf.  Results for orders placed in visit on 11/05/12  POCT URINALYSIS DIPSTICK      Result Value Range   Color, UA yellow     Clarity, UA clear     Glucose, UA neg     Bilirubin, UA neg     Ketones, UA neg     Spec Grav, UA 1.020     Blood, UA neg     pH, UA 6.0     Protein, UA neg     Urobilinogen, UA 0.2     Nitrite, UA neg     Leukocytes, UA Negative    IFOBT (OCCULT BLOOD)      Result Value Range   IFOBT Negative          Assessment & Plan:  Patient doing very well at the present time. His hip pain is essentially resolved since he had hip surgeries. He does occasionally take a hydrocodone but not regularly. Meds were refilled. He was given a prescription for shingles vaccine. He is up-to-date on his colonoscopy . He was given a refill of his Viagra. A referral was made to dermatology to check some lesions on his arm and leg and he is to return to clinic here for removal of skin takes in both axilla

## 2012-11-06 LAB — LIPID PANEL
HDL: 29 mg/dL — ABNORMAL LOW (ref >39)
LDL Cholesterol: 96 mg/dL (ref 0–99)
Triglycerides: 176 mg/dL — ABNORMAL HIGH (ref <150)
VLDL: 35 mg/dL (ref 0–40)

## 2012-11-06 LAB — COMPREHENSIVE METABOLIC PANEL
AST: 15 U/L (ref 0–37)
Albumin: 4.3 g/dL (ref 3.5–5.2)
Alkaline Phosphatase: 69 U/L (ref 39–117)
BUN: 16 mg/dL (ref 6–23)
Creat: 1.2 mg/dL (ref 0.50–1.35)
Potassium: 3.9 mEq/L (ref 3.5–5.3)

## 2012-12-01 ENCOUNTER — Ambulatory Visit (INDEPENDENT_AMBULATORY_CARE_PROVIDER_SITE_OTHER): Payer: 59 | Admitting: Emergency Medicine

## 2012-12-01 VITALS — BP 138/82 | HR 64 | Temp 98.2°F | Resp 16 | Ht 69.0 in | Wt 218.0 lb

## 2012-12-01 DIAGNOSIS — K612 Anorectal abscess: Secondary | ICD-10-CM

## 2012-12-01 DIAGNOSIS — K611 Rectal abscess: Secondary | ICD-10-CM

## 2012-12-01 MED ORDER — DOXYCYCLINE HYCLATE 100 MG PO CAPS: 100.0000 mg | ORAL_CAPSULE | Freq: Two times a day (BID) | ORAL | Status: DC

## 2012-12-01 NOTE — Progress Notes (Signed)
  Subjective:    Patient ID: [name], male    DOB: Mar 24, 1956, 57 y.o.   MRN: [id number]  HPI patient presents with pain and swelling in his anal area for the last 48 hours. He has a history of perirectal abscess status post I&D 2-3 times in the past. He is not having as severe pain as he has had in the past but presented early with hopes he would not have to have an I&D.    Review of Systems     Objective:   Physical Exam is a scar present at 6:00 in the perianal area. There is some minimal tenderness in this area. Rectal exam reveals no anal tenderness        Assessment & Plan:  Possible early perirectal abscess. We'll treat with doxycycline sitz baths Tucks pads and see if we can avoid having to have an I and D.

## 2013-03-14 ENCOUNTER — Other Ambulatory Visit: Payer: Self-pay | Admitting: Emergency Medicine

## 2013-05-25 ENCOUNTER — Other Ambulatory Visit: Payer: Self-pay | Admitting: Emergency Medicine

## 2013-05-25 ENCOUNTER — Telehealth: Payer: Self-pay

## 2013-05-25 DIAGNOSIS — G8929 Other chronic pain: Secondary | ICD-10-CM

## 2013-05-25 MED ORDER — HYDROCODONE-ACETAMINOPHEN 5-325 MG PO TABS: ORAL_TABLET | ORAL | Status: DC

## 2013-05-25 NOTE — Telephone Encounter (Signed)
Patient needs a refill on hydrocodone.   [phone]

## 2013-08-02 ENCOUNTER — Telehealth: Payer: Self-pay

## 2013-08-02 NOTE — Telephone Encounter (Signed)
Patient needs a written prescription for hydrocodone.

## 2013-08-03 ENCOUNTER — Other Ambulatory Visit: Payer: Self-pay | Admitting: Emergency Medicine

## 2013-08-03 DIAGNOSIS — R52 Pain, unspecified: Secondary | ICD-10-CM

## 2013-08-03 MED ORDER — HYDROCODONE-ACETAMINOPHEN 5-325 MG PO TABS: ORAL_TABLET | ORAL | Status: DC

## 2013-08-05 NOTE — Telephone Encounter (Signed)
Rx ready and pt notified. 

## 2013-11-13 ENCOUNTER — Other Ambulatory Visit: Payer: Self-pay | Admitting: Emergency Medicine

## 2013-11-25 ENCOUNTER — Ambulatory Visit (INDEPENDENT_AMBULATORY_CARE_PROVIDER_SITE_OTHER): Payer: 59 | Admitting: Emergency Medicine

## 2013-11-25 ENCOUNTER — Encounter: Payer: Self-pay | Admitting: Emergency Medicine

## 2013-11-25 VITALS — BP 126/80 | HR 63 | Temp 97.8°F | Resp 16 | Ht 68.5 in | Wt 220.8 lb

## 2013-11-25 DIAGNOSIS — Q2111 Secundum atrial septal defect: Secondary | ICD-10-CM

## 2013-11-25 DIAGNOSIS — Z0000 Encounter for general adult medical examination without abnormal findings: Secondary | ICD-10-CM

## 2013-11-25 DIAGNOSIS — I10 Essential (primary) hypertension: Secondary | ICD-10-CM

## 2013-11-25 DIAGNOSIS — Q2112 Patent foramen ovale: Secondary | ICD-10-CM

## 2013-11-25 DIAGNOSIS — R52 Pain, unspecified: Secondary | ICD-10-CM

## 2013-11-25 DIAGNOSIS — Q211 Atrial septal defect: Secondary | ICD-10-CM

## 2013-11-25 DIAGNOSIS — N529 Male erectile dysfunction, unspecified: Secondary | ICD-10-CM

## 2013-11-25 LAB — POCT URINALYSIS DIPSTICK
BILIRUBIN UA: NEGATIVE
Blood, UA: NEGATIVE
GLUCOSE UA: NEGATIVE
KETONES UA: NEGATIVE
LEUKOCYTES UA: NEGATIVE
NITRITE UA: NEGATIVE
PH UA: 6
Protein, UA: NEGATIVE
Spec Grav, UA: 1.01
Urobilinogen, UA: 0.2

## 2013-11-25 LAB — CBC WITH DIFFERENTIAL/PLATELET
BASOS ABS: 0 10*3/uL (ref 0.0–0.1)
BASOS PCT: 0 % (ref 0–1)
Eosinophils Absolute: 0.1 10*3/uL (ref 0.0–0.7)
Eosinophils Relative: 1 % (ref 0–5)
HCT: 45.1 % (ref 39.0–52.0)
HEMOGLOBIN: 16 g/dL (ref 13.0–17.0)
Lymphocytes Relative: 25 % (ref 12–46)
Lymphs Abs: 1.7 10*3/uL (ref 0.7–4.0)
MCH: 31.6 pg (ref 26.0–34.0)
MCHC: 35.5 g/dL (ref 30.0–36.0)
MCV: 89.1 fL (ref 78.0–100.0)
MONOS PCT: 7 % (ref 3–12)
Monocytes Absolute: 0.5 10*3/uL (ref 0.1–1.0)
NEUTROS ABS: 4.5 10*3/uL (ref 1.7–7.7)
NEUTROS PCT: 67 % (ref 43–77)
Platelets: 180 10*3/uL (ref 150–400)
RBC: 5.06 MIL/uL (ref 4.22–5.81)
RDW: 13.7 % (ref 11.5–15.5)
WBC: 6.7 10*3/uL (ref 4.0–10.5)

## 2013-11-25 LAB — IFOBT (OCCULT BLOOD): IFOBT: NEGATIVE

## 2013-11-25 MED ORDER — LOSARTAN POTASSIUM 25 MG PO TABS: 25.0000 mg | ORAL_TABLET | Freq: Every day | ORAL | Status: DC

## 2013-11-25 MED ORDER — HYDROCODONE-ACETAMINOPHEN 5-325 MG PO TABS: ORAL_TABLET | ORAL | Status: DC

## 2013-11-25 MED ORDER — SILDENAFIL CITRATE 100 MG PO TABS: ORAL_TABLET | ORAL | Status: DC

## 2013-11-25 NOTE — Progress Notes (Signed)
@[email or web address] @  Patient ID: [name] MRN: [id number], DOB: Jul 30, 1955 58 y.o. Date of Encounter: 11/25/2013, 1:45 PM  Primary Physician: Jenny Reichmann, MD  Chief Complaint: Physical (CPE)  HPI: 58 y.o. y/o male with history noted below here for CPE.  Doing well. No issues/complaints.  Review of Systems: Consitutional: No fever, chills, fatigue, night sweats, lymphadenopathy, or weight changes. Eyes: No visual changes, eye redness, or discharge. ENT/Mouth: Ears: No otalgia, tinnitus, hearing loss, discharge. Nose: No congestion, rhinorrhea, sinus pain, or epistaxis. Throat: No sore throat, post nasal drip, or teeth pain. Cardiovascular: No CP, palpitations, diaphoresis, DOE, edema, orthopnea, PND he has a history of a PFO. Respiratory: No cough, hemoptysis, SOB, or wheezing. Gastrointestinal: No anorexia, dysphagia, reflux, pain, nausea, vomiting, hematemesis, diarrhea, constipation, BRBPR, or melena. Genitourinary: No dysuria, frequency, urgency, hematuria, incontinence, nocturia, decreased urinary stream, discharge, , or testicular pain/masses. He does have TED and occasionally takes Viagra to Musculoskeletal: No decreased ROM, myalgias, stiffness, joint swelling, or weakness. He has some hip pain after working all day. He is status post hip replacement. Skin: No rash, erythema, lesion changes, pain, warmth, jaundice, or pruritis. Neurological: No headache, dizziness, syncope, seizures, tremors, memory loss, coordination problems, or paresthesias. Psychological: No anxiety, depression, hallucinations, SI/HI. Endocrine: No fatigue, polydipsia, polyphagia, polyuria, or known diabetes. All other systems were reviewed and are otherwise negative.  Past Medical History  Diagnosis Date  . Arthritis      Past Surgical History  Procedure Laterality Date  . Joint replacement      Home Meds:  Prior to Admission medications   Medication Sig Start Date End Date Taking? Authorizing  Provider  aspirin EC 81 MG tablet Take 81 mg by mouth daily.   Yes Historical Provider, MD  Cholecalciferol (VITAMIN D PO) Take by mouth daily.   Yes Historical Provider, MD  fish oil-omega-3 fatty acids 1000 MG capsule Take 1 g by mouth daily.   Yes Historical Provider, MD  HYDROcodone-acetaminophen (NORCO/VICODIN) 5-325 MG per tablet One half to one tablet at night for pain 08/03/13  Yes [name], MD  losartan (COZAAR) 25 MG tablet Take 1 tablet (25 mg total) by mouth daily. PATIENT NEEDS OFFICE VISIT FOR ADDITIONAL REFILLS   Yes [name], MD  Multiple Vitamin (DAILY VITAMINS PO) Take by mouth.   Yes Historical Provider, MD  sildenafil (VIAGRA) 100 MG tablet TAKE 1/2 TO 1 TABLET PRIOR TO INTERCOURSE 11/05/12  Yes [name], MD  doxycycline (VIBRAMYCIN) 100 MG capsule Take 1 capsule (100 mg total) by mouth 2 (two) times daily. 12/01/12   [name], MD    Allergies:  Allergies  Allergen Reactions  . Morphine And Related Itching    History   Social History  . Marital Status: Married    Spouse Name: N/A    Number of Children: N/A  . Years of Education: N/A   Occupational History  . Maintenance    Social History Main Topics  . Smoking status: Former Smoker -- 0.80 packs/day for 20 years    Types: Cigarettes    Quit date: 06/26/1996  . Smokeless tobacco: Not on file  . Alcohol Use: No     Comment: once a week or less a beer  . Drug Use: No  . Sexual Activity: Yes    Partners: Female   Other Topics Concern  . Not on file   Social History Narrative   Married. Education: [employer].     Family History  Problem  Relation Age of Onset  . Cancer Father     lung  . Cancer Sister     breast  . Heart disease Maternal Grandmother   . Cancer Paternal Grandmother     Physical Exam: Blood pressure 126/80, pulse 63, temperature 97.8 F (36.6 C), temperature source Oral, resp. rate 16, height 5' 8.5" (1.74 m), weight 220 lb 12.8 oz (100.154 kg), SpO2 96.00%.  General:  Well developed, well nourished, in no acute distress. HEENT: Normocephalic, atraumatic. Conjunctiva pink, sclera non-icteric. Pupils 2 mm constricting to 1 mm, round, regular, and equally reactive to light and accomodation. EOMI. Internal auditory canal clear. TMs with good cone of light and without pathology. Nasal mucosa pink. Nares are without discharge. No sinus tenderness. Oral mucosa pink. Dentition. Pharynx without exudate.   Neck: Supple. Trachea midline. No thyromegaly. Full ROM. No lymphadenopathy. Lungs: Clear to auscultation bilaterally without wheezes, rales, or rhonchi. Breathing is of normal effort and unlabored. Cardiovascular: RRR with S1 S2. No murmurs, rubs, or gallops appreciated. Distal pulses 2+ symmetrically. No carotid or abdominal bruits. Abdomen: Soft, non-tender, non-distended with normoactive bowel sounds. No hepatosplenomegaly or masses. No rebound/guarding. No CVA tenderness. Without hernias.  Rectal: No external hemorrhoids or fissures. Rectal vault without masses.  Genitourinary:  circumcised male. No penile lesions. Testes descended bilaterally, and smooth without tenderness or masses. There is a BB size calcific area lateral left lobe of the prostate  Musculoskeletal: Full range of motion and 5/5 strength throughout. Without swelling, atrophy, tenderness, crepitus, or warmth. Extremities without clubbing, cyanosis, or edema. Calves supple. Skin: Warm and moist without erythema, ecchymosis, wounds, or rash. Neuro: A+Ox3. CN II-XII grossly intact. Moves all extremities spontaneously. Full sensation throughout. Normal gait. DTR 2+ throughout upper and lower extremities. Finger to nose intact. Psych:  Responds to questions appropriately with a normal affect.  EKG right bundle branch block. Assessment/Plan:  58 y.o. y/o male here for his physical. He is actually doing very well. He occasionally takes hydrocodone at night for hip pain. He remains very physically active. He  has a history of a PFO and has seen Dr. Einar Gip in the past. He is on medication for hypertension has occasional ED. Overall he is doing well. He does have this tiny calcified area lateral prostate and has seen a urologist before. We'll check PSA if it was normal just check this area on a yearly basis. -  Signed, Nena Jordan, MD 11/25/2013 1:45 PM

## 2013-11-26 LAB — LIPID PANEL
Cholesterol: 172 mg/dL (ref 0–200)
HDL: 34 mg/dL — ABNORMAL LOW (ref >39)
LDL Cholesterol: 98 mg/dL (ref 0–99)
Total CHOL/HDL Ratio: 5.1 Ratio
Triglycerides: 201 mg/dL — ABNORMAL HIGH (ref <150)
VLDL: 40 mg/dL (ref 0–40)

## 2013-11-26 LAB — COMPLETE METABOLIC PANEL WITH GFR
ALBUMIN: 4.5 g/dL (ref 3.5–5.2)
ALT: 21 U/L (ref 0–53)
AST: 16 U/L (ref 0–37)
Alkaline Phosphatase: 71 U/L (ref 39–117)
BUN: 17 mg/dL (ref 6–23)
CHLORIDE: 101 meq/L (ref 96–112)
CO2: 26 mEq/L (ref 19–32)
Calcium: 10.1 mg/dL (ref 8.4–10.5)
Creat: 1.14 mg/dL (ref 0.50–1.35)
GFR, Est African American: 83 mL/min
GFR, Est Non African American: 71 mL/min
GLUCOSE: 83 mg/dL (ref 70–99)
POTASSIUM: 4.4 meq/L (ref 3.5–5.3)
SODIUM: 139 meq/L (ref 135–145)
TOTAL PROTEIN: 7.6 g/dL (ref 6.0–8.3)
Total Bilirubin: 0.7 mg/dL (ref 0.2–1.2)

## 2013-11-26 LAB — PSA: PSA: 0.82 ng/mL (ref <=4.00)

## 2013-11-26 LAB — TSH: TSH: 1.169 u[IU]/mL (ref 0.350–4.500)

## 2013-11-26 LAB — URIC ACID: URIC ACID, SERUM: 7.3 mg/dL (ref 4.0–7.8)

## 2014-02-25 ENCOUNTER — Encounter: Payer: Self-pay | Admitting: Emergency Medicine

## 2014-05-29 ENCOUNTER — Telehealth: Payer: Self-pay

## 2014-05-29 NOTE — Telephone Encounter (Signed)
Dr. Everlene Farrier    Med Refill   HYDROcodone-acetaminophen (NORCO/VICODIN) 5-325 MG per tablet  [phone]   Call when ready for pick up

## 2014-06-01 ENCOUNTER — Other Ambulatory Visit: Payer: Self-pay | Admitting: Emergency Medicine

## 2014-06-01 DIAGNOSIS — R52 Pain, unspecified: Secondary | ICD-10-CM

## 2014-06-01 MED ORDER — HYDROCODONE-ACETAMINOPHEN 5-325 MG PO TABS
ORAL_TABLET | ORAL | Status: DC
Start: 2014-06-01 — End: 2014-11-24

## 2014-06-02 NOTE — Telephone Encounter (Signed)
Notified pt ready. 

## 2014-11-12 ENCOUNTER — Encounter: Payer: Self-pay | Admitting: Emergency Medicine

## 2014-11-12 ENCOUNTER — Ambulatory Visit (INDEPENDENT_AMBULATORY_CARE_PROVIDER_SITE_OTHER): Payer: 59 | Admitting: Emergency Medicine

## 2014-11-12 VITALS — BP 169/96 | HR 65 | Temp 98.3°F | Resp 16 | Ht 69.0 in | Wt 221.0 lb

## 2014-11-12 DIAGNOSIS — R1909 Other intra-abdominal and pelvic swelling, mass and lump: Secondary | ICD-10-CM | POA: Diagnosis not present

## 2014-11-12 DIAGNOSIS — I10 Essential (primary) hypertension: Secondary | ICD-10-CM | POA: Diagnosis not present

## 2014-11-12 LAB — CBC WITH DIFFERENTIAL/PLATELET
Basophils Absolute: 0 10*3/uL (ref 0.0–0.1)
Basophils Relative: 0 % (ref 0–1)
EOS ABS: 0.1 10*3/uL (ref 0.0–0.7)
Eosinophils Relative: 1 % (ref 0–5)
HCT: 45.8 % (ref 39.0–52.0)
Hemoglobin: 15.9 g/dL (ref 13.0–17.0)
LYMPHS PCT: 22 % (ref 12–46)
Lymphs Abs: 1.3 10*3/uL (ref 0.7–4.0)
MCH: 31.3 pg (ref 26.0–34.0)
MCHC: 34.7 g/dL (ref 30.0–36.0)
MCV: 90.2 fL (ref 78.0–100.0)
MONO ABS: 0.3 10*3/uL (ref 0.1–1.0)
MPV: 10.6 fL (ref 8.6–12.4)
Monocytes Relative: 5 % (ref 3–12)
NEUTROS ABS: 4.2 10*3/uL (ref 1.7–7.7)
NEUTROS PCT: 72 % (ref 43–77)
PLATELETS: 162 10*3/uL (ref 150–400)
RBC: 5.08 MIL/uL (ref 4.22–5.81)
RDW: 13.5 % (ref 11.5–15.5)
WBC: 5.8 10*3/uL (ref 4.0–10.5)

## 2014-11-12 LAB — BASIC METABOLIC PANEL
BUN: 16 mg/dL (ref 6–23)
CHLORIDE: 102 meq/L (ref 96–112)
CO2: 26 mEq/L (ref 19–32)
Calcium: 9.6 mg/dL (ref 8.4–10.5)
Creat: 1.14 mg/dL (ref 0.50–1.35)
Glucose, Bld: 101 mg/dL — ABNORMAL HIGH (ref 70–99)
Potassium: 4.1 mEq/L (ref 3.5–5.3)
Sodium: 139 mEq/L (ref 135–145)

## 2014-11-12 NOTE — Progress Notes (Signed)
   Subjective:  This chart was scribed for Nena Jordan, MD by [facility], medical scribe at [facility].The patient was seen in exam room 21 and the patient's care was started at 11:46 AM.   Patient ID: [name], male    DOB: 1956/05/11, 59 y.o.   MRN: [id number] Chief Complaint  Patient presents with  . Adenopathy    swollen lymph node in groin area   HPI HPI Comments: [name] is a 59 y.o. male who presents to [facility] concerned about swollen knot around his testicles that he first noticed 4-5 months ago. He is feeling an urgency to urinate. He denies any pain. He has had both hips replaced, last done 4-5 years ago. He is seen by urologist and his last PSA was normal. Pt states it protrudes more after work.   Review of Systems  Genitourinary: Positive for urgency. Negative for penile pain and testicular pain.      Objective:  BP 169/96 mmHg  Pulse 65  Temp(Src) 98.3 F (36.8 C) (Oral)  Resp 16  Ht 5\' 9"  (1.753 m)  Wt 221 lb (100.245 kg)  BMI 32.62 kg/m2  SpO2 97% Physical Exam  Nursing note and vitals reviewed. CONSTITUTIONAL: Well developed/well nourished HEAD: Normocephalic/atraumatic EYES: EOMI/PERRL ENMT: Mucous membranes moist NECK: supple no meningeal signs SPINE/BACK:entire spine nontender CV: S1/S2 noted, no murmurs/rubs/gallops noted LUNGS: Lungs are clear to auscultation bilaterally, no apparent distress ABDOMEN: soft, nontender, no rebound or guarding, bowel sounds noted throughout abdomen GU:no cva tenderness, large reducible indirect right inguinal hernia and a small left inguinal hernia. NEURO: Pt is awake/alert/appropriate, moves all extremitiesx4.  No facial droop.   EXTREMITIES: pulses normal/equal, full ROM SKIN: warm, color normal PSYCH: no abnormalities of mood noted, alert and oriented to situation  Lab Results  Component Value Date   PSA 0.82 11/25/2013      Assessment & Plan:    Patient has bilateral hernias worse on the right. We'll go ahead and get a CT abdomen pelvis be sure there are no other intra-abdominal abnormalities. Referral made to general surgery for their opinion. I personally performed the services described in this documentation, which was scribed in my presence. The recorded information has been reviewed and is accurate. Blood pressure was up. His wife is going to monitor this at home.  Nena Jordan, MD

## 2014-11-12 NOTE — Progress Notes (Signed)
° °  Subjective:  This chart was scribed for Nena Jordan, MD by [facility], medical scribe at [facility].The patient was seen in exam room 21 and the patient's care was started at 11:46 AM.   Patient ID: [name], male    DOB: 1956/03/18, 59 y.o.   MRN: [id number] Chief Complaint  Patient presents with   Adenopathy    swollen lymph node in groin area   HPI HPI Comments: [name] is a 59 y.o. male who presents to [facility] concerned about swollen knot around his testicles that he first noticed 4-5 months ago. He is feeling an urgency to urinate. He denies any pain. He has had both hips replaced, last done 4-5 years ago. He is seen by urologist and his last PSA was normal. Pt states it protrudes more after work.   Review of Systems  Genitourinary: Positive for urgency. Negative for penile pain and testicular pain.      Objective:  BP 169/96 mmHg   Pulse 65   Temp(Src) 98.3 F (36.8 C) (Oral)   Resp 16   Ht 5\' 9"  (1.753 m)   Wt 221 lb (100.245 kg)   BMI 32.62 kg/m2   SpO2 97% Physical Exam  Nursing note and vitals reviewed. CONSTITUTIONAL: Well developed/well nourished HEAD: Normocephalic/atraumatic EYES: EOMI/PERRL ENMT: Mucous membranes moist NECK: supple no meningeal signs SPINE/BACK:entire spine nontender CV: S1/S2 noted, no murmurs/rubs/gallops noted LUNGS: Lungs are clear to auscultation bilaterally, no apparent distress ABDOMEN: soft, nontender, no rebound or guarding, bowel sounds noted throughout abdomen GU:no cva tenderness, large reducible indirect right inguinal hernia and a small left inguinal hernia. NEURO: Pt is awake/alert/appropriate, moves all extremitiesx4.  No facial droop.   EXTREMITIES: pulses normal/equal, full ROM SKIN: warm, color normal PSYCH: no abnormalities of mood noted, alert and oriented to situation  Lab Results  Component Value Date   PSA 0.82 11/25/2013      Assessment & Plan:    Patient has bilateral hernias worse on the right. We'll go ahead and get a CT abdomen pelvis be sure there are no other intra-abdominal abnormalities. Referral made to general surgery for their opinion. I personally performed the services described in this documentation, which was scribed in my presence. The recorded information has been reviewed and is accurate.  Nena Jordan, MD

## 2014-11-13 LAB — SEDIMENTATION RATE: SED RATE: 9 mm/h (ref 0–20)

## 2014-11-20 ENCOUNTER — Ambulatory Visit
Admission: RE | Admit: 2014-11-20 | Discharge: 2014-11-20 | Disposition: A | Discharge status: Home / Self Care | Payer: 59 | Source: Ambulatory Visit | Attending: Emergency Medicine | Admitting: Emergency Medicine

## 2014-11-20 DIAGNOSIS — R1909 Other intra-abdominal and pelvic swelling, mass and lump: Secondary | ICD-10-CM

## 2014-11-20 MED ORDER — IOPAMIDOL (ISOVUE-300) INJECTION 61%
125.0000 mL | Freq: Once | INTRAVENOUS | Status: AC | PRN
  Administered 2014-11-20: 125 mL via INTRAVENOUS

## 2014-11-24 ENCOUNTER — Other Ambulatory Visit: Payer: Self-pay | Admitting: Emergency Medicine

## 2014-11-24 ENCOUNTER — Telehealth: Payer: Self-pay | Admitting: Emergency Medicine

## 2014-11-24 DIAGNOSIS — R52 Pain, unspecified: Secondary | ICD-10-CM

## 2014-11-24 MED ORDER — HYDROCODONE-ACETAMINOPHEN 5-325 MG PO TABS
ORAL_TABLET | ORAL | Status: DC
Start: 2014-11-24 — End: 2015-02-09

## 2014-11-24 NOTE — Telephone Encounter (Signed)
Call patient that he can pick up his prescription at 104

## 2014-11-24 NOTE — Telephone Encounter (Signed)
Patient is requesting a refill on Hydrocodone. He states that he has an appointment with [facility] on Thursday however he is going out of town this week and needs his medication.   [phone]

## 2014-12-03 ENCOUNTER — Ambulatory Visit (INDEPENDENT_AMBULATORY_CARE_PROVIDER_SITE_OTHER): Payer: 59 | Admitting: Emergency Medicine

## 2014-12-03 ENCOUNTER — Encounter: Payer: Self-pay | Admitting: Emergency Medicine

## 2014-12-03 VITALS — BP 156/90 | HR 56 | Temp 98.0°F | Resp 16 | Ht 68.0 in | Wt 214.6 lb

## 2014-12-03 DIAGNOSIS — R1909 Other intra-abdominal and pelvic swelling, mass and lump: Secondary | ICD-10-CM

## 2014-12-03 DIAGNOSIS — I10 Essential (primary) hypertension: Secondary | ICD-10-CM

## 2014-12-03 DIAGNOSIS — Z0000 Encounter for general adult medical examination without abnormal findings: Secondary | ICD-10-CM | POA: Diagnosis not present

## 2014-12-03 DIAGNOSIS — K4020 Bilateral inguinal hernia, without obstruction or gangrene, not specified as recurrent: Secondary | ICD-10-CM

## 2014-12-03 DIAGNOSIS — E785 Hyperlipidemia, unspecified: Secondary | ICD-10-CM | POA: Diagnosis not present

## 2014-12-03 DIAGNOSIS — Z1211 Encounter for screening for malignant neoplasm of colon: Secondary | ICD-10-CM

## 2014-12-03 DIAGNOSIS — M1000 Idiopathic gout, unspecified site: Secondary | ICD-10-CM | POA: Diagnosis not present

## 2014-12-03 DIAGNOSIS — K4090 Unilateral inguinal hernia, without obstruction or gangrene, not specified as recurrent: Secondary | ICD-10-CM | POA: Insufficient documentation

## 2014-12-03 DIAGNOSIS — N529 Male erectile dysfunction, unspecified: Secondary | ICD-10-CM | POA: Diagnosis not present

## 2014-12-03 LAB — CBC WITH DIFFERENTIAL/PLATELET
BASOS PCT: 1 % (ref 0–1)
Basophils Absolute: 0.1 10*3/uL (ref 0.0–0.1)
Eosinophils Absolute: 0.2 10*3/uL (ref 0.0–0.7)
Eosinophils Relative: 4 % (ref 0–5)
HCT: 43.4 % (ref 39.0–52.0)
Hemoglobin: 15 g/dL (ref 13.0–17.0)
LYMPHS ABS: 1.5 10*3/uL (ref 0.7–4.0)
Lymphocytes Relative: 28 % (ref 12–46)
MCH: 30.7 pg (ref 26.0–34.0)
MCHC: 34.6 g/dL (ref 30.0–36.0)
MCV: 88.9 fL (ref 78.0–100.0)
MONOS PCT: 8 % (ref 3–12)
MPV: 9.6 fL (ref 8.6–12.4)
Monocytes Absolute: 0.4 10*3/uL (ref 0.1–1.0)
NEUTROS ABS: 3.1 10*3/uL (ref 1.7–7.7)
NEUTROS PCT: 59 % (ref 43–77)
Platelets: 166 10*3/uL (ref 150–400)
RBC: 4.88 MIL/uL (ref 4.22–5.81)
RDW: 13.5 % (ref 11.5–15.5)
WBC: 5.3 10*3/uL (ref 4.0–10.5)

## 2014-12-03 LAB — COMPREHENSIVE METABOLIC PANEL
ALK PHOS: 68 U/L (ref 39–117)
ALT: 26 U/L (ref 0–53)
AST: 19 U/L (ref 0–37)
Albumin: 4.1 g/dL (ref 3.5–5.2)
BUN: 15 mg/dL (ref 6–23)
CALCIUM: 9.7 mg/dL (ref 8.4–10.5)
CHLORIDE: 104 meq/L (ref 96–112)
CO2: 25 mEq/L (ref 19–32)
CREATININE: 1.28 mg/dL (ref 0.50–1.35)
Glucose, Bld: 94 mg/dL (ref 70–99)
Potassium: 4.5 mEq/L (ref 3.5–5.3)
SODIUM: 141 meq/L (ref 135–145)
TOTAL PROTEIN: 7.5 g/dL (ref 6.0–8.3)
Total Bilirubin: 0.7 mg/dL (ref 0.2–1.2)

## 2014-12-03 LAB — POCT URINALYSIS DIPSTICK
BILIRUBIN UA: NEGATIVE
Blood, UA: NEGATIVE
Glucose, UA: NEGATIVE
Ketones, UA: NEGATIVE
Leukocytes, UA: NEGATIVE
NITRITE UA: NEGATIVE
Protein, UA: NEGATIVE
SPEC GRAV UA: 1.015
Urobilinogen, UA: 0.2
pH, UA: 6

## 2014-12-03 LAB — POCT UA - MICROSCOPIC ONLY
Bacteria, U Microscopic: NEGATIVE
EPITHELIAL CELLS, URINE PER MICROSCOPY: NEGATIVE
MUCUS UA: NEGATIVE
RBC, urine, microscopic: NEGATIVE
WBC, UR, HPF, POC: NEGATIVE

## 2014-12-03 LAB — LIPID PANEL
CHOL/HDL RATIO: 6.7 ratio
CHOLESTEROL: 174 mg/dL (ref 0–200)
HDL: 26 mg/dL — AB (ref >=40)
LDL Cholesterol: 109 mg/dL — ABNORMAL HIGH (ref 0–99)
Triglycerides: 194 mg/dL — ABNORMAL HIGH (ref <150)
VLDL: 39 mg/dL (ref 0–40)

## 2014-12-03 LAB — URIC ACID: URIC ACID, SERUM: 8.2 mg/dL — AB (ref 4.0–7.8)

## 2014-12-03 LAB — TSH: TSH: 0.972 u[IU]/mL (ref 0.350–4.500)

## 2014-12-03 LAB — IFOBT (OCCULT BLOOD): IFOBT: NEGATIVE

## 2014-12-03 MED ORDER — LOSARTAN POTASSIUM 50 MG PO TABS: 50.0000 mg | ORAL_TABLET | Freq: Every day | ORAL | Status: DC

## 2014-12-03 MED ORDER — SILDENAFIL CITRATE 20 MG PO TABS: ORAL_TABLET | ORAL | Status: DC

## 2014-12-03 NOTE — Addendum Note (Signed)
Addended by: Tommas Olp B on: 12/03/2014 01:02 PM   Modules accepted: Orders

## 2014-12-03 NOTE — Progress Notes (Signed)
Subjective:  This chart was scribed for James Russian, MD by Ladene Artist, ED Scribe. The patient was seen in room 23. Patient's care was started at 12:12 PM.   Patient ID: [name], male    DOB: December 10, 1955, 59 y.o.   MRN: [id number]  Chief Complaint  Patient presents with  . Annual Exam   HPI HPI Comments: [name] is a 59 y.o. male, with a h/o HTN, PFO, arthritis, who presents to the [facility] for an annual exam. Pt's immunizations are UTD. Pt denies hip pain. Pt denies tobacco use.   L Toe Pain Pt reports intermittent L little toe pain for the past few weeks. He describes pain as a mild, aching sensation that has self-resolved at this time. Pt denies associated redness. He reports h/o gout but states that this pain feels different. He suspects that this pain could be attributed to his shoes.   Dermatology Pt plans to return to dermatology for lesions on his L lower arm that he has recently noticed. Pt reports similar lesions in the past that he burned.   Hernia  Pt has an upcoming appointment in 2 weeks with a surgeon to discuss possible hernia repair.    Cardiology Pt is followed by cardiologist Dr. Adrian Prows annually for patent foramen ovale.  Hypertension  Pt expresses concerns of elevated BP of 156/90 today. He reports recent weight loss and states that he has been compliant with Losartan. Pt requests a refill at this time.   Past Medical History  Diagnosis Date  . Arthritis   . Hypertension    Current Outpatient Prescriptions on File Prior to Visit  Medication Sig Dispense Refill  . aspirin EC 81 MG tablet Take 81 mg by mouth daily.    . Cholecalciferol (VITAMIN D PO) Take by mouth daily.    . fish oil-omega-3 fatty acids 1000 MG capsule Take 1 g by mouth daily.    Marland Kitchen HYDROcodone-acetaminophen (NORCO/VICODIN) 5-325 MG per tablet One half to one tablet at night for pain 60 tablet 0  . losartan (COZAAR) 25 MG tablet Take 1 tablet (25  mg total) by mouth daily. PATIENT NEEDS OFFICE VISIT FOR ADDITIONAL REFILLS 30 tablet 11  . Multiple Vitamin (DAILY VITAMINS PO) Take by mouth.    . sildenafil (VIAGRA) 100 MG tablet TAKE 1/2 TO 1 TABLET PRIOR TO INTERCOURSE 6 tablet 11   No current facility-administered medications on file prior to visit.   Allergies  Allergen Reactions  . Morphine And Related Itching   Review of Systems  Musculoskeletal: Positive for arthralgias.  Skin: Negative for color change.   BP 156/90 mmHg  Pulse 56  Temp(Src) 98 F (36.7 C) (Oral)  Resp 16  Ht 5\' 8"  (1.727 m)  Wt 214 lb 9.6 oz (97.342 kg)  BMI 32.64 kg/m2  SpO2 98% Repeat BP: 134/92 on R and 140/90 on L.     Objective:   Physical Exam  Constitutional: He is oriented to person, place, and time. He appears well-developed and well-nourished. No distress.  HENT:  Head: Normocephalic and atraumatic.  Eyes: Conjunctivae and EOM are normal.  Neck: Neck supple. No tracheal deviation present.  Cardiovascular: Normal rate.   Pulmonary/Chest: Effort normal. No respiratory distress.  Abdominal: A hernia is present. Hernia confirmed positive in the right inguinal area. Left inguinal: Previously noted L inguinal hernia was not palpable today.   Musculoskeletal: Normal range of motion.  Mild redness of L fifth  toe.   Neurological: He is alert and oriented to person, place, and time.  Skin: Skin is warm and dry.  Psychiatric: He has a normal mood and affect. His behavior is normal.  Nursing note and vitals reviewed.     Assessment & Plan:  1. Physical exam  - CBC with Differential/Platelet - Comprehensive metabolic panel - PSA - TSH - POCT urinalysis dipstick - POCT UA - Microscopic Only  2. Mass of right inguinal region Patient has an hernia which needs repair and is scheduled to see the surgeon  3. Erectile dysfunction, unspecified erectile dysfunction type  - sildenafil (REVATIO) 20 MG tablet; Take 2-3 tablets one hour prior to  intercourse  Dispense: 50 tablet; Refill: 4  4. Essential hypertension I increase the dose of his losartan to 50 mg daily - losartan (COZAAR) 50 MG tablet; Take 1 tablet (50 mg total) by mouth daily.  Dispense: 90 tablet; Refill: 3 - Comprehensive metabolic panel  5. Special screening for malignant neoplasms, colon  - Ambulatory referral to Gastroenterology  6. Idiopathic gout, unspecified chronicity, unspecified site  - Uric acid  7. Hyperlipidemia  - Lipid panel   I personally performed the services described in this documentation, which was scribed in my presence. The recorded information has been reviewed and is accurate.  James Queen, MD  [facility]  12/03/2014 12:53 PM

## 2014-12-03 NOTE — Addendum Note (Signed)
Addended by: [name] A on: 12/03/2014 01:14 PM   Modules accepted: Orders

## 2014-12-03 NOTE — Progress Notes (Signed)
   Subjective:    Patient ID: [name], male    DOB: 1955/08/12, 59 y.o.   MRN: [id number]  HPI    Review of Systems  Constitutional: Negative.   HENT: Negative.   Eyes: Negative.   Respiratory: Negative.   Cardiovascular: Negative.   Gastrointestinal: Negative.   Endocrine: Negative.   Genitourinary: Negative.   Musculoskeletal: Positive for myalgias.  Skin: Negative.   Allergic/Immunologic: Negative.   Neurological: Negative.   Hematological: Negative.   Psychiatric/Behavioral: Negative.        Objective:   Physical Exam        Assessment & Plan:

## 2014-12-03 NOTE — Addendum Note (Signed)
Addended by: Tommas Olp B on: 12/03/2014 01:22 PM   Modules accepted: Orders

## 2014-12-04 LAB — PSA: PSA: 0.8 ng/mL (ref <=4.00)

## 2014-12-10 ENCOUNTER — Telehealth: Payer: Self-pay | Admitting: Emergency Medicine

## 2014-12-10 NOTE — Telephone Encounter (Signed)
Patient is out of town and is having a gout flare up. He is requesting medicine be sent to [facility] in [place], [place]. Pharmacy phone number is [phone].  [phone]

## 2014-12-10 NOTE — Telephone Encounter (Signed)
Can we RX?

## 2014-12-10 NOTE — Telephone Encounter (Signed)
Uric acid level was elevated 7 days ago. Called in script for indomethacin to pharmacy and number provided. Please let patient know.

## 2014-12-11 NOTE — Telephone Encounter (Signed)
Left message on machine letting pt know,

## 2014-12-12 ENCOUNTER — Telehealth: Payer: Self-pay

## 2014-12-12 NOTE — Telephone Encounter (Signed)
Called pt to let him know. Pt received Rx.

## 2015-01-04 ENCOUNTER — Ambulatory Visit: Payer: Self-pay | Admitting: Surgery

## 2015-01-04 NOTE — H&P (Signed)
History of Present Illness [name]. James Fossett MD; 01/04/2015 2:29 PM) Patient words: hernia.  The patient is a 59 year old male who presents with an inguinal hernia. Referred by Dr. Ivar Bury for evaluation of bilateral inguinal hernias This is a 59 year old male with a history of patent foramen ovale who presents with a 9 month history of an enlarging bulge in his right groin. This has caused some mild discomfort. He denies any obstructive symptoms. His primary care doctor examined him and noticed a right inguinal hernia. There is also question of a left inguinal hernia. He had a CT scan of the abdomen and pelvis on 11/20/14 which showed bilateral that containing inguinal hernias right greater than left. He presents now to discuss hernia repair. Cardiology - Dr. Adrian Prows CLINICAL DATA: Mass in right inguinal region for 5 months. Mass in left inguinal region. EXAM: CT ABDOMEN AND PELVIS WITH CONTRAST TECHNIQUE: Multidetector CT imaging of the abdomen and pelvis was performed using the standard protocol following bolus administration of intravenous contrast. CONTRAST: 116mL ISOVUE-300 IOPAMIDOL (ISOVUE-300) INJECTION 61% COMPARISON: None. FINDINGS: Lower chest: Lung bases are clear. No effusions. Heart is normal size. Hepatobiliary: Small hypodensity in the right hepatic lobe compatible with a small cyst. Gallbladder unremarkable. Pancreas: No focal abnormality or ductal dilatation. Spleen: No focal abnormality. Normal size. Adrenals/Urinary Tract: No focal renal or adrenal abnormality. No hydronephrosis. Urinary bladder is unremarkable. Stomach/Bowel: Descending colonic and sigmoid diverticulosis. No active diverticulitis. Stomach and small bowel are decompressed and unremarkable. Vascular/Lymphatic: No retroperitoneal or mesenteric adenopathy. Aorta normal caliber. Reproductive: No mass or other significant abnormality. Other: No free fluid or free air. There is scratch head there are bilateral  inguinal hernias containing fat, right larger than left. Musculoskeletal: Bilateral hip replacements noted which causes beam hardening artifact in the lower pelvis. No acute bony abnormality. IMPRESSION: Bilateral inguinal hernias containing fat, right larger than left. Electronically Signed By: [name] M.D. On: 11/20/2014 12:11 Other Problems ([name], CMA; 01/04/2015 10:49 AM) Arthritis High blood pressure Hypercholesterolemia  Past Surgical History [name], CMA; 01/04/2015 10:49 AM) Colon Polyp Removal - Colonoscopy Hip Surgery Bilateral.  Diagnostic Studies History [name], CMA; 01/04/2015 10:49 AM) Colonoscopy 5-10 years ago  Allergies [name], CMA; 01/04/2015 10:51 AM) Morphine Sulfate (Concentrate) *ANALGESICS - OPIOID*  Medication History ([name], CMA; 01/04/2015 10:54 AM) Aspirin (81MG  Tablet Chewable, Oral) Active. Vitamin D (1000UNIT Tablet, Oral) Active. Fish Oil (1000MG  Capsule, Oral) Active. Losartan Potassium (50MG  Tablet, Oral) Active. Multivitamins (Oral) Active. Revatio (20MG  Tablet, Oral) Active. Medications Reconciled  Social History [name], CMA; 01/04/2015 10:49 AM) Alcohol use Occasional alcohol use. Caffeine use Carbonated beverages, Coffee, Tea. Tobacco use Former smoker.  Family History [name], [place]; 01/04/2015 10:49 AM) Arthritis Father. Breast Cancer Sister. Hypertension Father. Respiratory Condition Father. Thyroid problems Mother.     Review of Systems [name] CMA; 01/04/2015 10:49 AM) General Not Present- Appetite Loss, Chills, Fatigue, Fever, Night Sweats, Weight Gain and Weight Loss. Skin Not Present- Change in Wart/Mole, Dryness, Hives, Jaundice, New Lesions, Non-Healing Wounds, Rash and Ulcer. HEENT Not Present- Earache, Hearing Loss, Hoarseness, Nose Bleed, Oral Ulcers, Ringing in the Ears, Seasonal Allergies, Sinus Pain, Sore Throat, Visual Disturbances, Wears glasses/contact  lenses and Yellow Eyes. Respiratory Not Present- Bloody sputum, Chronic Cough, Difficulty Breathing, Snoring and Wheezing. Breast Not Present- Breast Mass, Breast Pain, Nipple Discharge and Skin Changes. Cardiovascular Not Present- Chest Pain, Difficulty Breathing Lying Down, Leg Cramps, Palpitations, Rapid Heart Rate, Shortness of Breath and Swelling  of Extremities. Gastrointestinal Not Present- Abdominal Pain, Bloating, Bloody Stool, Change in Bowel Habits, Chronic diarrhea, Constipation, Difficulty Swallowing, Excessive gas, Gets full quickly at meals, Hemorrhoids, Indigestion, Nausea, Rectal Pain and Vomiting. Male Genitourinary Not Present- Blood in Urine, Change in Urinary Stream, Frequency, Impotence, Nocturia, Painful Urination, Urgency and Urine Leakage. Musculoskeletal Not Present- Back Pain, Joint Pain, Joint Stiffness, Muscle Pain, Muscle Weakness and Swelling of Extremities. Neurological Not Present- Decreased Memory, Fainting, Headaches, Numbness, Seizures, Tingling, Tremor, Trouble walking and Weakness. Psychiatric Not Present- Anxiety, Bipolar, Change in Sleep Pattern, Depression, Fearful and Frequent crying. Endocrine Not Present- Cold Intolerance, Excessive Hunger, Hair Changes, Heat Intolerance, Hot flashes and New Diabetes. Hematology Not Present- Easy Bruising, Excessive bleeding, Gland problems, HIV and Persistent Infections.  Vitals ([name] CMA; 01/04/2015 10:51 AM) 01/04/2015 10:51 AM Weight: 216 lb Height: 66in Body Surface Area: 2.14 m Body Mass Index: 34.86 kg/m Temp.: 58F(Temporal)  Pulse: 65 (Regular)  BP: 124/74 (Sitting, Left Arm, Standard)     Physical Exam James Key K. Roselani Grajeda MD; 01/04/2015 2:29 PM)  The physical exam findings are as follows: Note:WDWN in NAD HEENT: EOMI, sclera anicteric Neck: No masses, no thyromegaly Lungs: CTA bilaterally; normal respiratory effort CV: Regular rate and rhythm; no murmurs Abd: +bowel sounds, soft,  non-tender, no masses GU: bilateral descended testes; no testicular masses; reducible right inguinal hernia; small spontaneously reducible left inguinal hernia Ext: Well-perfused; no edema Skin: Warm, dry; no sign of jaundice    Assessment & Plan James Key K. Nivek Powley MD; 01/04/2015 11:27 AM)  BILATERAL INGUINAL HERNIA WITHOUT OBSTRUCTION OR GANGRENE, RECURRENCE NOT SPECIFIED (550.92  K40.20)  Current Plans Schedule for Surgery - Laparoscopic bilateral inguinal hernia repairs with mesh. The surgical procedure has been discussed with the patient. Potential risks, benefits, alternative treatments, and expected outcomes have been explained. All of the patient's questions at this time have been answered. The likelihood of reaching the patient's treatment goal is good. The patient understand the proposed surgical procedure and wishes to proceed.  [name]. Georgette Dover, MD, [facility]  General/ Trauma Surgery  01/04/2015 2:30 PM

## 2015-02-02 NOTE — Pre-Procedure Instructions (Addendum)
[name]  02/02/2015      [id number]on, [place] - [address] [place] [address] Phone: [phone] Fax: [phone]  [id number] [place], [place] - [address] [place] [address] Phone: [phone] Fax: [phone]  [place], [place] - [address] [place] [address] Phone: [phone] Fax: [phone]    Your procedure is scheduled on Tuesday, August 16.  Report to [name] Admitting at 6:30A.M.                Your surgery is scheduled for 8:30 A.M.   Call this number if you have problems the morning of surgery: [phone]                For any other questions, please call [phone], Monday - Friday 8 AM - 4 PM.   Remember:  Do not eat food or drink liquids after midnight Monday, August 15.  Take these medicines the morning of surgery with A SIP OF WATER :loratadine (CLARITIN).                     Take if needed:[name]                  Stop taking Aspirin, Coumadin, Plavix, Effient and Herbal medications.  Do not take any NSAIDs ie: Ibuprofen,  Advil,Naproxen or any medication containing Aspiri    Do not wear jewelry   Do not wear lotions, powders, or perfumes.                Men may shave face and neck.   Do not bring valuables to the hospital   [name] is not responsible for any belongings or valuables.  Contacts, dentures or bridgework may not be worn into surgery.  Leave your suitcase in the car.  After surgery it may be brought to your room.  For patients admitted to the hospital, discharge time will be determined by your treatment team.  Patients discharged the day of surgery will not be allowed to drive home.   Name and phone number of your driver:  wife Special instructions: Special Instructions: [name]  Before  surgery, you can play an important role.  Because skin is not sterile, your skin needs to be as free of germs as possible.  You can reduce the number of germs on you skin by washing with CHG (chlorahexidine gluconate) soap before surgery.  CHG is an antiseptic cleaner which kills germs and bonds with the skin to continue killing germs even after washing.  Please DO NOT use if you have an allergy to CHG or antibacterial soaps.  If your skin becomes reddened/irritated stop using the CHG and inform your nurse when you arrive at Short Stay.  Do not shave (including legs and underarms) for at least 48 hours prior to the first CHG shower.  You may shave your face.  Please follow these instructions carefully:   1.  Shower with CHG Soap the night before surgery and the  morning of Surgery.  2.  If you choose to wash your hair, wash your hair first as usual with your  normal shampoo.  3.  After you shampoo, rinse your hair and body thoroughly to remove the  Shampoo.  4.  Use CHG as you would any other liquid soap.  You  can apply chg directly to the skin and wash gently with scrungie or a clean washcloth.  5.  Apply the CHG Soap to your body ONLY FROM THE NECK DOWN.    Do not use on open wounds or open sores.  Avoid contact with your eyes, ears, mouth and genitals (private parts).  Wash genitals (private parts)   with your normal soap.  6.  Wash thoroughly, paying special attention to the area where your surgery will be performed.  7.  Thoroughly rinse your body with warm water from the neck down.  8.  DO NOT shower/wash with your normal soap after using and rinsing off   the CHG Soap.  9.  Pat yourself dry with a clean towel.            10.  Wear clean pajamas.            11.  Place clean sheets on your bed the night of your first shower and do not sleep with pets.  Day of Surgery  Do not apply any lotions/deodorants the morning of surgery.  Please wear clean clothes to the hospital/surgery  center.  Please read over the following fact sheets that you were given. Pain Booklet, Coughing and Deep Breathing and Surgical Site Infection Prevention

## 2015-02-03 ENCOUNTER — Encounter (HOSPITAL_COMMUNITY): Payer: Self-pay

## 2015-02-03 ENCOUNTER — Encounter (HOSPITAL_COMMUNITY)
Admission: RE | Admit: 2015-02-03 | Discharge: 2015-02-03 | Disposition: A | Discharge status: Home / Self Care | Payer: 59 | Source: Ambulatory Visit | Attending: Surgery | Admitting: Surgery

## 2015-02-03 DIAGNOSIS — Z01818 Encounter for other preprocedural examination: Secondary | ICD-10-CM | POA: Insufficient documentation

## 2015-02-03 DIAGNOSIS — Z01812 Encounter for preprocedural laboratory examination: Secondary | ICD-10-CM | POA: Diagnosis not present

## 2015-02-03 DIAGNOSIS — I10 Essential (primary) hypertension: Secondary | ICD-10-CM | POA: Insufficient documentation

## 2015-02-03 DIAGNOSIS — Z96643 Presence of artificial hip joint, bilateral: Secondary | ICD-10-CM | POA: Insufficient documentation

## 2015-02-03 DIAGNOSIS — Z7982 Long term (current) use of aspirin: Secondary | ICD-10-CM | POA: Insufficient documentation

## 2015-02-03 DIAGNOSIS — K4020 Bilateral inguinal hernia, without obstruction or gangrene, not specified as recurrent: Secondary | ICD-10-CM | POA: Insufficient documentation

## 2015-02-03 DIAGNOSIS — Z79899 Other long term (current) drug therapy: Secondary | ICD-10-CM | POA: Insufficient documentation

## 2015-02-03 DIAGNOSIS — M1990 Unspecified osteoarthritis, unspecified site: Secondary | ICD-10-CM | POA: Insufficient documentation

## 2015-02-03 DIAGNOSIS — Z87891 Personal history of nicotine dependence: Secondary | ICD-10-CM | POA: Diagnosis not present

## 2015-02-03 DIAGNOSIS — Q211 Atrial septal defect: Secondary | ICD-10-CM | POA: Diagnosis not present

## 2015-02-03 HISTORY — DX: Patent foramen ovale: Q21.12

## 2015-02-03 HISTORY — DX: Cardiac murmur, unspecified: R01.1

## 2015-02-03 HISTORY — DX: Other complications of anesthesia, initial encounter: T88.59XA

## 2015-02-03 HISTORY — DX: Atrial septal defect: Q21.1

## 2015-02-03 HISTORY — DX: Aneurysm of heart: I25.3

## 2015-02-03 HISTORY — DX: Adverse effect of unspecified anesthetic, initial encounter: T41.45XA

## 2015-02-03 LAB — BASIC METABOLIC PANEL
Anion gap: 7 (ref 5–15)
BUN: 18 mg/dL (ref 6–20)
CALCIUM: 9.5 mg/dL (ref 8.9–10.3)
CO2: 27 mmol/L (ref 22–32)
CREATININE: 1.28 mg/dL — AB (ref 0.61–1.24)
Chloride: 105 mmol/L (ref 101–111)
GFR calc Af Amer: >60 mL/min (ref >60)
GFR calc non Af Amer: >60 mL/min (ref >60)
GLUCOSE: 109 mg/dL — AB (ref 65–99)
Potassium: 4.4 mmol/L (ref 3.5–5.1)
Sodium: 139 mmol/L (ref 135–145)

## 2015-02-03 LAB — CBC
HEMATOCRIT: 45 % (ref 39.0–52.0)
HEMOGLOBIN: 15.5 g/dL (ref 13.0–17.0)
MCH: 31.6 pg (ref 26.0–34.0)
MCHC: 34.4 g/dL (ref 30.0–36.0)
MCV: 91.6 fL (ref 78.0–100.0)
Platelets: 135 10*3/uL — ABNORMAL LOW (ref 150–400)
RBC: 4.91 MIL/uL (ref 4.22–5.81)
RDW: 12.7 % (ref 11.5–15.5)
WBC: 6.2 10*3/uL (ref 4.0–10.5)

## 2015-02-03 NOTE — Progress Notes (Signed)
Request for EKG from Dr. Everlene Farrier, PCP, via fax.

## 2015-02-04 ENCOUNTER — Encounter (HOSPITAL_COMMUNITY): Payer: Self-pay

## 2015-02-04 NOTE — Progress Notes (Signed)
Anesthesia Chart Review: Patient is a 59 year old male scheduled for laparoscopic bilateral inguinal hernia repair on 02/09/15 by Dr. Georgette Dover.  History includes former smoker, HTN, murmur, PFO, arthritis, left THA '08, right THA '10.. PCP is Dr. [name]. Cardiologist is Dr. Adrian Prows [facility]).   For anesthesia history, he reported "irregular heart beat" during his 08/31/08 right THA. Discharge summary mentions that [facility] was consulted but did not mention why.  There are no EKGs on the patient in [facility] (all were outside facilities). Patient reported there were no special recommendations and was discharged home on 09/03/08. Anesthesia and cardiology records requested from [facility], but are still pending. Based on lack of EKG done during that admission, I would suspect his arrhythmias was either felt to be benign or very brief. (Update: Records received. Dr. Einar Gip was consulted for post-operative abnormal EKG with right BBB, but it was found to be an old findings and no new recommendations given.)  Meds include ASA (on hold), fish oil, Norco, loratadine, losartan, sildenafil PRN.  01/20/15 EKG [facility]): NSR, right BBB, LAD, LAFB, bifascicular block, low voltage.   01/13/15 Echo [facility]): LV cavity is normal in size. Mild concentric LVH. Normal global wall motion. Calculated LVEDF 69%. LA cavity is borderline dilated. 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 Large PFO versus small secundum ASD is present. Mild AR. Mild TR. No evidence of pulmonary hypertension. No diagnostic change from echo on 02/04/14. (TEE on 02/08/07 showed right to left shunting both at rest and with Valsalva.) According to Dr. Irven Shelling 01/20/15 note, "With regard to ASD, I had a long discussion with the patient that he is essentially asymptomatic and there is no indication for closure in the absence of symptoms of dyspnea, no evidence of right ventricular strain on echocardiogram. He is scheduled for laparoscopic  bilateral hernia repair, I do not see any contraindications for the same and I have sent clearance message to his surgery. His blood pressure is well controlled, otherwise is doing well, I will see him back on a when necessary basis."  Preoperative labs noted.   If no acute changes then I would anticipate that he could proceed as planned. Will need to be careful to avoid air in lines with known PFO history.  George Hugh [facility] Short Stay Center/Anesthesiology Phone [phone] 02/04/2015 3:32 PM

## 2015-02-04 NOTE — Anesthesia Preprocedure Evaluation (Addendum)
Anesthesia Evaluation  Patient identified by MRN, date of birth, ID band Patient awake    Reviewed: Allergy & Precautions, NPO status , Patient's Chart, lab work & pertinent test results  Airway Mallampati: II  TM Distance: >3 FB Neck ROM: Full    Dental   Pulmonary former smoker,  breath sounds clear to auscultation        Cardiovascular hypertension, Rhythm:Regular  PFO   Neuro/Psych    GI/Hepatic Neg liver ROS,   Endo/Other    Renal/GU negative Renal ROS     Musculoskeletal   Abdominal   Peds  Hematology   Anesthesia Other Findings   Reproductive/Obstetrics                           Anesthesia Physical Anesthesia Plan  ASA: II  Anesthesia Plan: General   Post-op Pain Management:    Induction: Intravenous  Airway Management Planned: Oral ETT  Additional Equipment:   Intra-op Plan:   Post-operative Plan: Extubation in OR  Informed Consent: I have reviewed the patients History and Physical, chart, labs and discussed the procedure including the risks, benefits and alternatives for the proposed anesthesia with the patient or authorized representative who has indicated his/her understanding and acceptance.   Dental advisory given  Plan Discussed with: CRNA and Anesthesiologist  Anesthesia Plan Comments:         Anesthesia Quick Evaluation

## 2015-02-08 MED ORDER — CHLORHEXIDINE GLUCONATE 4 % EX LIQD: 1.0000 "application " | CUTANEOUS | Status: DC

## 2015-02-08 MED ORDER — CEFAZOLIN SODIUM-DEXTROSE 2-3 GM-% IV SOLR
2.0000 g | INTRAVENOUS | Status: AC
  Administered 2015-02-09: 2 g via INTRAVENOUS
  Filled 2015-02-08: qty 50

## 2015-02-08 NOTE — Progress Notes (Signed)
Patient called to be here  At 530. [place] with patient

## 2015-02-09 ENCOUNTER — Ambulatory Visit (HOSPITAL_COMMUNITY): Payer: 59 | Admitting: Vascular Surgery

## 2015-02-09 ENCOUNTER — Ambulatory Visit (HOSPITAL_COMMUNITY)
Admission: RE | Admit: 2015-02-09 | Discharge: 2015-02-09 | Disposition: A | Discharge status: Home / Self Care | Payer: 59 | Source: Ambulatory Visit | Attending: Surgery | Admitting: Surgery

## 2015-02-09 ENCOUNTER — Ambulatory Visit (HOSPITAL_COMMUNITY): Payer: 59 | Admitting: Anesthesiology

## 2015-02-09 ENCOUNTER — Encounter (HOSPITAL_COMMUNITY): Payer: Self-pay | Admitting: *Deleted

## 2015-02-09 ENCOUNTER — Encounter (HOSPITAL_COMMUNITY)
Admission: RE | Disposition: A | Discharge status: Home / Self Care | Payer: Self-pay | Source: Ambulatory Visit | Attending: Surgery

## 2015-02-09 DIAGNOSIS — E780 Pure hypercholesterolemia: Secondary | ICD-10-CM | POA: Insufficient documentation

## 2015-02-09 DIAGNOSIS — Z7982 Long term (current) use of aspirin: Secondary | ICD-10-CM | POA: Diagnosis not present

## 2015-02-09 DIAGNOSIS — Z79899 Other long term (current) drug therapy: Secondary | ICD-10-CM | POA: Insufficient documentation

## 2015-02-09 DIAGNOSIS — Z96643 Presence of artificial hip joint, bilateral: Secondary | ICD-10-CM | POA: Diagnosis not present

## 2015-02-09 DIAGNOSIS — I10 Essential (primary) hypertension: Secondary | ICD-10-CM | POA: Insufficient documentation

## 2015-02-09 DIAGNOSIS — Z87891 Personal history of nicotine dependence: Secondary | ICD-10-CM | POA: Diagnosis not present

## 2015-02-09 DIAGNOSIS — M1990 Unspecified osteoarthritis, unspecified site: Secondary | ICD-10-CM | POA: Diagnosis not present

## 2015-02-09 DIAGNOSIS — K4020 Bilateral inguinal hernia, without obstruction or gangrene, not specified as recurrent: Secondary | ICD-10-CM | POA: Diagnosis present

## 2015-02-09 HISTORY — PX: INGUINAL HERNIA REPAIR: SHX194

## 2015-02-09 HISTORY — PX: INSERTION OF MESH: SHX5868

## 2015-02-09 SURGERY — REPAIR, HERNIA, INGUINAL, LAPAROSCOPIC
Anesthesia: General | Site: Groin | Laterality: Bilateral

## 2015-02-09 MED ORDER — MIDAZOLAM HCL 2 MG/2ML IJ SOLN
INTRAMUSCULAR | Status: AC
  Filled 2015-02-09: qty 4

## 2015-02-09 MED ORDER — DEXAMETHASONE SODIUM PHOSPHATE 4 MG/ML IJ SOLN
INTRAMUSCULAR | Status: DC | PRN
  Administered 2015-02-09: 8 mg via INTRAVENOUS

## 2015-02-09 MED ORDER — PROPOFOL 10 MG/ML IV BOLUS
INTRAVENOUS | Status: DC | PRN
  Administered 2015-02-09: 200 mg via INTRAVENOUS

## 2015-02-09 MED ORDER — PROMETHAZINE HCL 25 MG/ML IJ SOLN: 6.2500 mg | INTRAMUSCULAR | Status: DC | PRN

## 2015-02-09 MED ORDER — PHENYLEPHRINE 40 MCG/ML (10ML) SYRINGE FOR IV PUSH (FOR BLOOD PRESSURE SUPPORT)
PREFILLED_SYRINGE | INTRAVENOUS | Status: AC
  Filled 2015-02-09: qty 10

## 2015-02-09 MED ORDER — MORPHINE SULFATE (PF) 2 MG/ML IV SOLN: 2.0000 mg | INTRAVENOUS | Status: DC | PRN

## 2015-02-09 MED ORDER — OXYCODONE-ACETAMINOPHEN 5-325 MG PO TABS: 1.0000 | ORAL_TABLET | ORAL | Status: DC | PRN

## 2015-02-09 MED ORDER — DEXAMETHASONE SODIUM PHOSPHATE 4 MG/ML IJ SOLN
INTRAMUSCULAR | Status: AC
  Filled 2015-02-09: qty 2

## 2015-02-09 MED ORDER — ROCURONIUM BROMIDE 100 MG/10ML IV SOLN
INTRAVENOUS | Status: DC | PRN
  Administered 2015-02-09 (×2): 10 mg via INTRAVENOUS
  Administered 2015-02-09: 40 mg via INTRAVENOUS
  Administered 2015-02-09 (×2): 10 mg via INTRAVENOUS

## 2015-02-09 MED ORDER — BUPIVACAINE-EPINEPHRINE (PF) 0.25% -1:200000 IJ SOLN
INTRAMUSCULAR | Status: AC
  Filled 2015-02-09: qty 30

## 2015-02-09 MED ORDER — SUCCINYLCHOLINE CHLORIDE 20 MG/ML IJ SOLN
INTRAMUSCULAR | Status: AC
  Filled 2015-02-09: qty 1

## 2015-02-09 MED ORDER — MIDAZOLAM HCL 5 MG/5ML IJ SOLN
INTRAMUSCULAR | Status: DC | PRN
  Administered 2015-02-09: 2 mg via INTRAVENOUS

## 2015-02-09 MED ORDER — SUGAMMADEX SODIUM 500 MG/5ML IV SOLN
INTRAVENOUS | Status: DC | PRN
  Administered 2015-02-09: 400 mg via INTRAVENOUS

## 2015-02-09 MED ORDER — PROPOFOL 10 MG/ML IV BOLUS
INTRAVENOUS | Status: AC
  Filled 2015-02-09: qty 20

## 2015-02-09 MED ORDER — FENTANYL CITRATE (PF) 100 MCG/2ML IJ SOLN
25.0000 ug | INTRAMUSCULAR | Status: DC | PRN
  Administered 2015-02-09: 50 ug via INTRAVENOUS

## 2015-02-09 MED ORDER — ROCURONIUM BROMIDE 50 MG/5ML IV SOLN
INTRAVENOUS | Status: AC
  Filled 2015-02-09: qty 1

## 2015-02-09 MED ORDER — SODIUM CHLORIDE 0.9 % IJ SOLN
INTRAMUSCULAR | Status: AC
  Filled 2015-02-09: qty 10

## 2015-02-09 MED ORDER — BUPIVACAINE-EPINEPHRINE 0.25% -1:200000 IJ SOLN
INTRAMUSCULAR | Status: DC | PRN
  Administered 2015-02-09: 5 mL

## 2015-02-09 MED ORDER — FENTANYL CITRATE (PF) 250 MCG/5ML IJ SOLN
INTRAMUSCULAR | Status: DC | PRN
  Administered 2015-02-09: 50 ug via INTRAVENOUS
  Administered 2015-02-09: 100 ug via INTRAVENOUS
  Administered 2015-02-09 (×2): 50 ug via INTRAVENOUS

## 2015-02-09 MED ORDER — ONDANSETRON HCL 4 MG/2ML IJ SOLN: 4.0000 mg | INTRAMUSCULAR | Status: DC | PRN

## 2015-02-09 MED ORDER — LIDOCAINE HCL (CARDIAC) 20 MG/ML IV SOLN
INTRAVENOUS | Status: AC
  Filled 2015-02-09: qty 5

## 2015-02-09 MED ORDER — SODIUM CHLORIDE 0.9 % IR SOLN
Status: DC | PRN
  Administered 2015-02-09: 1000 mL

## 2015-02-09 MED ORDER — LIDOCAINE HCL (CARDIAC) 20 MG/ML IV SOLN
INTRAVENOUS | Status: DC | PRN
  Administered 2015-02-09: 80 mg via INTRAVENOUS

## 2015-02-09 MED ORDER — SUGAMMADEX SODIUM 500 MG/5ML IV SOLN
INTRAVENOUS | Status: AC
  Filled 2015-02-09: qty 5

## 2015-02-09 MED ORDER — FENTANYL CITRATE (PF) 100 MCG/2ML IJ SOLN
INTRAMUSCULAR | Status: AC
  Filled 2015-02-09: qty 2

## 2015-02-09 MED ORDER — FENTANYL CITRATE (PF) 250 MCG/5ML IJ SOLN
INTRAMUSCULAR | Status: AC
  Filled 2015-02-09: qty 5

## 2015-02-09 MED ORDER — ONDANSETRON HCL 4 MG/2ML IJ SOLN
INTRAMUSCULAR | Status: DC | PRN
  Administered 2015-02-09: 4 mg via INTRAVENOUS

## 2015-02-09 MED ORDER — LACTATED RINGERS IV SOLN
INTRAVENOUS | Status: DC | PRN
  Administered 2015-02-09 (×2): via INTRAVENOUS

## 2015-02-09 MED ORDER — EPHEDRINE SULFATE 50 MG/ML IJ SOLN
INTRAMUSCULAR | Status: AC
  Filled 2015-02-09: qty 1

## 2015-02-09 SURGICAL SUPPLY — 39 items
APPLIER CLIP LOGIC TI 5 (MISCELLANEOUS) IMPLANT
BENZOIN TINCTURE PRP APPL 2/3 (GAUZE/BANDAGES/DRESSINGS) ×2 IMPLANT
CANISTER SUCTION 2500CC (MISCELLANEOUS) IMPLANT
COVER SURGICAL LIGHT HANDLE (MISCELLANEOUS) ×2 IMPLANT
DEVICE SECURE STRAP 25 ABSORB (INSTRUMENTS) ×2 IMPLANT
DISSECT BALLN SPACEMKR + OVL (BALLOONS) ×2
DISSECTOR BALLN SPACEMKR + OVL (BALLOONS) ×1 IMPLANT
DISSECTOR BLUNT TIP ENDO 5MM (MISCELLANEOUS) IMPLANT
DRAPE LAPAROSCOPIC ABDOMINAL (DRAPES) ×2 IMPLANT
DRSG TEGADERM 2-3/8X2-3/4 SM (GAUZE/BANDAGES/DRESSINGS) ×4 IMPLANT
DRSG TEGADERM 4X4.75 (GAUZE/BANDAGES/DRESSINGS) ×2 IMPLANT
ELECT REM PT RETURN 9FT ADLT (ELECTROSURGICAL) ×2
ELECTRODE REM PT RTRN 9FT ADLT (ELECTROSURGICAL) ×1 IMPLANT
GAUZE SPONGE 2X2 8PLY STRL LF (GAUZE/BANDAGES/DRESSINGS) ×1 IMPLANT
GLOVE BIO SURGEON STRL SZ7 (GLOVE) ×2 IMPLANT
GLOVE BIOGEL PI IND STRL 7.0 (GLOVE) ×1 IMPLANT
GLOVE BIOGEL PI IND STRL 7.5 (GLOVE) ×1 IMPLANT
GLOVE BIOGEL PI INDICATOR 7.0 (GLOVE) ×1
GLOVE BIOGEL PI INDICATOR 7.5 (GLOVE) ×1
GLOVE SURG SS PI 7.0 STRL IVOR (GLOVE) ×6 IMPLANT
GOWN STRL REUS W/ TWL LRG LVL3 (GOWN DISPOSABLE) ×3 IMPLANT
GOWN STRL REUS W/TWL LRG LVL3 (GOWN DISPOSABLE) ×3
KIT BASIN OR (CUSTOM PROCEDURE TRAY) ×2 IMPLANT
KIT ROOM TURNOVER OR (KITS) ×2 IMPLANT
MESH 3DMAX LIGHT 4.1X6.2 LT LR (Mesh General) ×2 IMPLANT
MESH 3DMAX LIGHT 4.1X6.2 RT LR (Mesh General) ×2 IMPLANT
NS IRRIG 1000ML POUR BTL (IV SOLUTION) ×2 IMPLANT
PAD ARMBOARD 7.5X6 YLW CONV (MISCELLANEOUS) ×4 IMPLANT
SCISSORS LAP 5X35 DISP (ENDOMECHANICALS) ×2 IMPLANT
SET IRRIG TUBING LAPAROSCOPIC (IRRIGATION / IRRIGATOR) IMPLANT
SET TROCAR LAP APPLE-HUNT 5MM (ENDOMECHANICALS) ×2 IMPLANT
SPONGE GAUZE 2X2 STER 10/PKG (GAUZE/BANDAGES/DRESSINGS) ×1
STRIP CLOSURE SKIN 1/2X4 (GAUZE/BANDAGES/DRESSINGS) ×2 IMPLANT
SUT MNCRL AB 4-0 PS2 18 (SUTURE) ×2 IMPLANT
TOWEL OR 17X24 6PK STRL BLUE (TOWEL DISPOSABLE) IMPLANT
TOWEL OR 17X26 10 PK STRL BLUE (TOWEL DISPOSABLE) ×2 IMPLANT
TRAY FOLEY CATH 16FR SILVER (SET/KITS/TRAYS/PACK) ×2 IMPLANT
TRAY LAPAROSCOPIC MC (CUSTOM PROCEDURE TRAY) ×2 IMPLANT
TUBING INSUFFLATION (TUBING) ×2 IMPLANT

## 2015-02-09 NOTE — Anesthesia Procedure Notes (Signed)
Procedure Name: Intubation Date/Time: 02/09/2015 7:31 AM Performed by: Julian Reil Pre-anesthesia Checklist: Patient identified, Emergency Drugs available, Suction available and Patient being monitored Patient Re-evaluated:Patient Re-evaluated prior to inductionOxygen Delivery Method: Circle system utilized Preoxygenation: Pre-oxygenation with 100% oxygen Intubation Type: IV induction Ventilation: Mask ventilation without difficulty Laryngoscope Size: Mac and 4 Grade View: Grade II Tube type: Oral Tube size: 7.5 mm Number of attempts: 1 Airway Equipment and Method: Stylet Placement Confirmation: ETT inserted through vocal cords under direct vision,  positive ETCO2 and breath sounds checked- equal and bilateral Secured at: 22 cm Tube secured with: Tape Dental Injury: Teeth and Oropharynx as per pre-operative assessment

## 2015-02-09 NOTE — Anesthesia Postprocedure Evaluation (Signed)
  Anesthesia Post-op Note  Patient: KELTEN ENOCHS  Procedure(s) Performed: Procedure(s): LAPAROSCOPIC BILATERAL INGUINAL HERNIA REPAIRS (Bilateral) INSERTION OF MESH (Bilateral)  Patient Location: PACU  Anesthesia Type:General  Level of Consciousness: awake, alert  and oriented  Airway and Oxygen Therapy: Patient Spontanous Breathing and Patient connected to nasal cannula oxygen  Post-op Pain: mild  Post-op Assessment: Post-op Vital signs reviewed, Patient's Cardiovascular Status Stable, Respiratory Function Stable, Patent Airway and Pain level controlled              Post-op Vital Signs: stable  Last Vitals:  Filed Vitals:   02/09/15 1110  BP: 167/98  Pulse: 59  Temp:   Resp: 16    Complications: No apparent anesthesia complications

## 2015-02-09 NOTE — Op Note (Signed)
Preop diagnosis: Bilateral inguinal hernias Postoperative diagnosis: Same Procedure performed: Laparoscopic bilateral inguinal hernia repairs with mesh Surgeon:Mercadez Heitman K. Anesthesia: Gen. endotracheal Indications: This is a 59 year old male who presents with a nine-month history of a bulge in his right groin. This was begun to cause some discomfort. A CT scan showed a right inguinal hernia containing fat as well as a small left inguinal hernia. No intestine was involved. He presents now for elective repair.  Description of procedure: The patient's brought to the operating room placed in a supine position on the operating room table. After an adequate level of general anesthesia was obtained a Foley catheter was placed under sterile technique. The patient's lower abdomen was shaved prepped with ChloraPrep and draped sterile fashion. A timeout was taken to ensure the proper patient and proper procedure. We infiltrated an area just to the left of midline 2 cm below the umbilicus with 3.54% Marcaine with epinephrine. A transverse incision was made. Dissection was carried down to the anterior rectus sheath. The rectus sheath was incised horizontally. The rectus muscle was identified. It was retracted laterally and we entered the rectal muscular space. The dissection balloon was inserted and the rectal muscular space and advanced onto the symphysis pubis. We placed the laparoscope into the trocar and inflated the balloon holding in place for several minutes for hemostasis. The balloon was then removed and we insufflated CO2 into the preperitoneal space maintaining a maximum pressure of 15 mmHg. The angled laparoscope was inserted. Two 5 mm ports were placed in the lower midline.  We began working on the patient's right groin. The preperitoneal space was opened widely out to the right anterior superior iliac spine. There is no sign of indirect hernia defect. We reduced a fairly large indirect hernia sac and  dissected away from the spermatic cord. We completed our dissection on the right and then inserted a right sided 3-D max light mesh. This was deployed and secured with the secure strap device. We used a total of 4 tacks. The mesh covered the midline out to the lateral part of our dissection. The lower edge of the mesh was tucked beneath the edge of the peritoneum. This covered the indirect and direct spaces.  We then turned our attention to the patient's left side. We completed a similar dissection. The hernia sac was much smaller on the side and was dissected free from the spermatic cord. We used a left sided 3-D max light mesh and secured it with the secure strap device. Once we were satisfied with the mesh coverage insufflation was then released under direct vision. The fascia of the rectus sheath was closed with 0 Vicryl. 4 Monocryl was used to close the skin incisions. Steri-Strips and clean dressings were applied. I palpated both testicles within the scrotum. Patient has some air within his groin and down into his scrotum bilaterally. We compressed this to squeeze out some of the excess air. The patient was then next been brought to recovery in stable condition. All sponge, instrument, needle counts are correct.  Imogene Burn. Georgette Dover, MD, [facility]  General/ Trauma Surgery  02/09/2015 9:30 AM

## 2015-02-09 NOTE — Transfer of Care (Signed)
Immediate Anesthesia Transfer of Care Note  Patient: [name]  Procedure(s) Performed: Procedure(s): LAPAROSCOPIC BILATERAL INGUINAL HERNIA REPAIRS (Bilateral) INSERTION OF MESH (Bilateral)  Patient Location: PACU  Anesthesia Type:General  Level of Consciousness: awake, alert , oriented and patient cooperative  Airway & Oxygen Therapy: Patient Spontanous Breathing and Patient connected to nasal cannula oxygen  Post-op Assessment: Report given to RN, Post -op Vital signs reviewed and stable and Patient moving all extremities  Post vital signs: Reviewed and stable  Last Vitals:  Filed Vitals:   02/09/15 0554  BP: 141/93  Pulse: 64  Temp: 36.5 C  Resp: 20    Complications: No apparent anesthesia complications

## 2015-02-09 NOTE — Discharge Instructions (Signed)
[facility] ° ° INGUINAL HERNIA REPAIR: POST OP INSTRUCTIONS ° °Always review your discharge instruction sheet given to you by the facility where your surgery was performed. °IF YOU HAVE DISABILITY OR FAMILY LEAVE FORMS, YOU MUST BRING THEM TO THE OFFICE FOR PROCESSING.   °DO NOT GIVE THEM TO YOUR DOCTOR. ° °1. A  prescription for pain medication may be given to you upon discharge.  Take your pain medication as prescribed, if needed.  If narcotic pain medicine is not needed, then you may take acetaminophen (Tylenol) or ibuprofen (Advil) as needed. °2. Take your usually prescribed medications unless otherwise directed. °3. If you need a refill on your pain medication, please contact your pharmacy.  They will contact our office to request authorization. Prescriptions will not be filled after 5 pm or on week-ends. °4. You should follow a light diet the first 24 hours after arrival home, such as soup and crackers, etc.  Be sure to include lots of fluids daily.  Resume your normal diet the day after surgery. °5. Most patients will experience some swelling and bruising around the umbilicus or in the groin and scrotum.  Ice packs and reclining will help.  Swelling and bruising can take several days to resolve.  °6. It is common to experience some constipation if taking pain medication after surgery.  Increasing fluid intake and taking a stool softener (such as Colace) will usually help or prevent this problem from occurring.  A mild laxative (Milk of Magnesia or Miralax) should be taken according to package directions if there are no bowel movements after 48 hours. °7. Unless discharge instructions indicate otherwise, you may remove your bandages 24-48 hours after surgery, and you may shower at that time.  You will have steri-strips (small skin tapes) in place directly over the incision.  These strips should be left on the skin for 7-10 days. °8. ACTIVITIES:  You may resume regular (light) daily activities  beginning the next day--such as daily self-care, walking, climbing stairs--gradually increasing activities as tolerated.  You may have sexual intercourse when it is comfortable.  Refrain from any heavy lifting or straining until approved by your doctor. °a. You may drive when you are no longer taking prescription pain medication, you can comfortably wear a seatbelt, and you can safely maneuver your car and apply brakes. °b. RETURN TO WORK:  2-3 weeks with light duty - no lifting over 15 lbs. °9. You should see your doctor in the office for a follow-up appointment approximately 2-3 weeks after your surgery.  Make sure that you call for this appointment within a day or two after you arrive home to insure a convenient appointment time. °10. OTHER INSTRUCTIONS:  __________________________________________________________________________________________________________________________________________________________________________________________  °WHEN TO CALL YOUR DOCTOR: °1. Fever over 101.0 °2. Inability to urinate °3. Nausea and/or vomiting °4. Extreme swelling or bruising °5. Continued bleeding from incision. °6. Increased pain, redness, or drainage from the incision ° °The clinic staff is available to answer your questions during regular business hours.  Please don’t hesitate to call and ask to speak to one of the nurses for clinical concerns.  If you have a medical emergency, go to the nearest emergency room or call 911.  A surgeon from [facility] is always on call at the hospital ° ° °[address], [address], [place], [place]  [address] ? ° [address] [address], [place],    [address] °[phone]    [phone]    FAX [phone] °Web site:   [email or web address] ° ° °

## 2015-02-09 NOTE — H&P (Signed)
History of Present Illness  Patient words: hernia.  The patient is a 59 year old male who presents with an inguinal hernia. Referred by Dr. Ivar Bury for evaluation of bilateral inguinal hernias This is a 59 year old male with a history of patent foramen ovale who presents with a 9 month history of an enlarging bulge in his right groin. This has caused some mild discomfort. He denies any obstructive symptoms. His primary care doctor examined him and noticed a right inguinal hernia. There is also question of a left inguinal hernia. He had a CT scan of the abdomen and pelvis on 11/20/14 which showed bilateral that containing inguinal hernias right greater than left. He presents now to discuss hernia repair. Cardiology - Dr. Adrian Prows  CLINICAL DATA: Mass in right inguinal region for 5 months. Mass in left inguinal region. EXAM: CT ABDOMEN AND PELVIS WITH CONTRAST TECHNIQUE: Multidetector CT imaging of the abdomen and pelvis was performed using the standard protocol following bolus administration of intravenous contrast. CONTRAST: 147mL ISOVUE-300 IOPAMIDOL (ISOVUE-300) INJECTION 61% COMPARISON: None. FINDINGS: Lower chest: Lung bases are clear. No effusions. Heart is normal size. Hepatobiliary: Small hypodensity in the right hepatic lobe compatible with a small cyst. Gallbladder unremarkable. Pancreas: No focal abnormality or ductal dilatation. Spleen: No focal abnormality. Normal size. Adrenals/Urinary Tract: No focal renal or adrenal abnormality. No hydronephrosis. Urinary bladder is unremarkable. Stomach/Bowel: Descending colonic and sigmoid diverticulosis. No active diverticulitis. Stomach and small bowel are decompressed and unremarkable. Vascular/Lymphatic: No retroperitoneal or mesenteric adenopathy. Aorta normal caliber. Reproductive: No mass or other significant abnormality. Other: No free fluid or free air. There are bilateral inguinal hernias containing fat, right larger than left.  Musculoskeletal: Bilateral hip replacements noted which causes beam hardening artifact in the lower pelvis. No acute bony abnormality. IMPRESSION: Bilateral inguinal hernias containing fat, right larger than left. Electronically Signed By: [name] M.D. On: 11/20/2014 12:11  Other Problems  Arthritis High blood pressure Hypercholesterolemia  Past Surgical History  Colon Polyp Removal - Colonoscopy Hip Surgery Bilateral.  Diagnostic Studies History Colonoscopy 5-10 years ago  Allergies  Morphine Sulfate (Concentrate) *ANALGESICS - OPIOID*  Medication History Aspirin (81MG  Tablet Chewable, Oral) Active. Vitamin D (1000UNIT Tablet, Oral) Active. Fish Oil (1000MG  Capsule, Oral) Active. Losartan Potassium (50MG  Tablet, Oral) Active. Multivitamins (Oral) Active. Revatio (20MG  Tablet, Oral) Active. Medications Reconciled  Social History Alcohol use Occasional alcohol use. Caffeine use Carbonated beverages, Coffee, Tea. Tobacco use Former smoker.  Family History Arthritis Father. Breast Cancer Sister. Hypertension Father. Respiratory Condition Father. Thyroid problems Mother.     Review of Systems  General Not Present- Appetite Loss, Chills, Fatigue, Fever, Night Sweats, Weight Gain and Weight Loss. Skin Not Present- Change in Wart/Mole, Dryness, Hives, Jaundice, New Lesions, Non-Healing Wounds, Rash and Ulcer. HEENT Not Present- Earache, Hearing Loss, Hoarseness, Nose Bleed, Oral Ulcers, Ringing in the Ears, Seasonal Allergies, Sinus Pain, Sore Throat, Visual Disturbances, Wears glasses/contact lenses and Yellow Eyes. Respiratory Not Present- Bloody sputum, Chronic Cough, Difficulty Breathing, Snoring and Wheezing. Breast Not Present- Breast Mass, Breast Pain, Nipple Discharge and Skin Changes. Cardiovascular Not Present- Chest Pain, Difficulty Breathing Lying Down, Leg Cramps, Palpitations, Rapid Heart Rate, Shortness of Breath and Swelling of  Extremities. Gastrointestinal Not Present- Abdominal Pain, Bloating, Bloody Stool, Change in Bowel Habits, Chronic diarrhea, Constipation, Difficulty Swallowing, Excessive gas, Gets full quickly at meals, Hemorrhoids, Indigestion, Nausea, Rectal Pain and Vomiting. Male Genitourinary Not Present- Blood in Urine, Change in Urinary Stream, Frequency, Impotence, Nocturia, Painful Urination, Urgency and Urine  Leakage. Musculoskeletal Not Present- Back Pain, Joint Pain, Joint Stiffness, Muscle Pain, Muscle Weakness and Swelling of Extremities. Neurological Not Present- Decreased Memory, Fainting, Headaches, Numbness, Seizures, Tingling, Tremor, Trouble walking and Weakness. Psychiatric Not Present- Anxiety, Bipolar, Change in Sleep Pattern, Depression, Fearful and Frequent crying. Endocrine Not Present- Cold Intolerance, Excessive Hunger, Hair Changes, Heat Intolerance, Hot flashes and New Diabetes. Hematology Not Present- Easy Bruising, Excessive bleeding, Gland problems, HIV and Persistent Infections.  Vitals  Weight: 216 lb Height: 66in Body Surface Area: 2.14 m Body Mass Index: 34.86 kg/m Temp.: 59F(Temporal)  Pulse: 65 (Regular)  BP: 124/74 (Sitting, Left Arm, Standard)     Physical Exam   The physical exam findings are as follows: Note:WDWN in NAD HEENT: EOMI, sclera anicteric Neck: No masses, no thyromegaly Lungs: CTA bilaterally; normal respiratory effort CV: Regular rate and rhythm; no murmurs Abd: +bowel sounds, soft, non-tender, no masses GU: bilateral descended testes; no testicular masses; reducible right inguinal hernia; small spontaneously reducible left inguinal hernia Ext: Well-perfused; no edema Skin: Warm, dry; no sign of jaundice    Assessment & Plan   BILATERAL INGUINAL HERNIA WITHOUT OBSTRUCTION OR GANGRENE, RECURRENCE NOT SPECIFIED (550.92  K40.20)  Current Plans Schedule for Surgery - Laparoscopic bilateral inguinal hernia repairs with mesh.  The surgical procedure has been discussed with the patient. Potential risks, benefits, alternative treatments, and expected outcomes have been explained. All of the patient's questions at this time have been answered. The likelihood of reaching the patient's treatment goal is good. The patient understand the proposed surgical procedure and wishes to proceed.  Imogene Burn. Georgette Dover, MD, [facility]  General/ Trauma Surgery  02/09/2015 6:57 AM

## 2015-02-10 ENCOUNTER — Encounter (HOSPITAL_COMMUNITY): Payer: Self-pay | Admitting: Surgery

## 2015-03-30 ENCOUNTER — Encounter: Payer: Self-pay | Admitting: Emergency Medicine

## 2015-08-27 ENCOUNTER — Other Ambulatory Visit: Payer: Self-pay | Admitting: Emergency Medicine

## 2015-08-28 ENCOUNTER — Telehealth: Payer: Self-pay

## 2015-08-28 NOTE — Telephone Encounter (Signed)
Left VM informing pt he can pick up hydrocodone rx.

## 2015-09-23 DIAGNOSIS — H5203 Hypermetropia, bilateral: Secondary | ICD-10-CM | POA: Diagnosis not present

## 2015-09-23 DIAGNOSIS — H52203 Unspecified astigmatism, bilateral: Secondary | ICD-10-CM | POA: Diagnosis not present

## 2015-09-23 DIAGNOSIS — H524 Presbyopia: Secondary | ICD-10-CM | POA: Diagnosis not present

## 2015-11-29 ENCOUNTER — Other Ambulatory Visit: Payer: Self-pay | Admitting: Emergency Medicine

## 2015-12-09 ENCOUNTER — Encounter: Payer: Self-pay | Admitting: Physician Assistant

## 2015-12-09 ENCOUNTER — Ambulatory Visit: Payer: Self-pay | Admitting: Physician Assistant

## 2015-12-09 VITALS — BP 120/74 | HR 76 | Temp 98.3°F

## 2015-12-09 DIAGNOSIS — J018 Other acute sinusitis: Secondary | ICD-10-CM

## 2015-12-09 MED ORDER — AZITHROMYCIN 250 MG PO TABS
ORAL_TABLET | ORAL | Status: DC
Start: 2015-12-09 — End: 2016-01-15

## 2015-12-09 NOTE — Progress Notes (Signed)
S: C/o runny nose and congestion for 3 days, no fever, chills, cp/sob, v/d; mucus is clear/white and thick, cough is sporadic, c/o of facial and dental pain.   Using otc meds: claritin and ?sudafed  O: PE: vitals wnl, nad, perrl eomi, normocephalic, tms dull, nasal mucosa red and swollen, throat injected, neck supple no lymph, lungs c t a, cv rrr, neuro intact  A:  Acute sinusitis   P: drink fluids, continue regular meds , use otc meds of choice, return if not improving in 5 days, return earlier if worsening , if worsening over the weekend use zpack

## 2015-12-30 ENCOUNTER — Encounter: Payer: 59 | Admitting: Emergency Medicine

## 2016-01-15 ENCOUNTER — Ambulatory Visit (INDEPENDENT_AMBULATORY_CARE_PROVIDER_SITE_OTHER): Payer: 59 | Admitting: Emergency Medicine

## 2016-01-15 VITALS — BP 144/92 | HR 68 | Temp 98.5°F | Resp 18 | Ht 68.25 in | Wt 216.4 lb

## 2016-01-15 DIAGNOSIS — M25552 Pain in left hip: Secondary | ICD-10-CM

## 2016-01-15 DIAGNOSIS — M79674 Pain in right toe(s): Secondary | ICD-10-CM | POA: Diagnosis not present

## 2016-01-15 DIAGNOSIS — N529 Male erectile dysfunction, unspecified: Secondary | ICD-10-CM

## 2016-01-15 DIAGNOSIS — Z125 Encounter for screening for malignant neoplasm of prostate: Secondary | ICD-10-CM

## 2016-01-15 DIAGNOSIS — I10 Essential (primary) hypertension: Secondary | ICD-10-CM | POA: Diagnosis not present

## 2016-01-15 DIAGNOSIS — Z1159 Encounter for screening for other viral diseases: Secondary | ICD-10-CM

## 2016-01-15 DIAGNOSIS — M25551 Pain in right hip: Secondary | ICD-10-CM | POA: Diagnosis not present

## 2016-01-15 DIAGNOSIS — Z0000 Encounter for general adult medical examination without abnormal findings: Secondary | ICD-10-CM | POA: Diagnosis not present

## 2016-01-15 LAB — POCT URINALYSIS DIP (MANUAL ENTRY)
BILIRUBIN UA: NEGATIVE
BILIRUBIN UA: NEGATIVE
Blood, UA: NEGATIVE
GLUCOSE UA: NEGATIVE
LEUKOCYTES UA: NEGATIVE
NITRITE UA: NEGATIVE
PH UA: 7
Protein Ur, POC: NEGATIVE
Spec Grav, UA: 1.015
Urobilinogen, UA: 0.2

## 2016-01-15 LAB — POC MICROSCOPIC URINALYSIS (UMFC): MUCUS RE: ABSENT

## 2016-01-15 LAB — COMPLETE METABOLIC PANEL WITH GFR
ALBUMIN: 4.5 g/dL (ref 3.6–5.1)
ALT: 22 U/L (ref 9–46)
AST: 16 U/L (ref 10–35)
Alkaline Phosphatase: 73 U/L (ref 40–115)
BUN: 18 mg/dL (ref 7–25)
CALCIUM: 9.5 mg/dL (ref 8.6–10.3)
CHLORIDE: 106 mmol/L (ref 98–110)
CO2: 24 mmol/L (ref 20–31)
CREATININE: 1.25 mg/dL (ref 0.70–1.33)
GFR, EST AFRICAN AMERICAN: 73 mL/min (ref >=60)
GFR, Est Non African American: 63 mL/min (ref >=60)
Glucose, Bld: 98 mg/dL (ref 65–99)
POTASSIUM: 4.5 mmol/L (ref 3.5–5.3)
SODIUM: 141 mmol/L (ref 135–146)
Total Bilirubin: 0.7 mg/dL (ref 0.2–1.2)
Total Protein: 7.2 g/dL (ref 6.1–8.1)

## 2016-01-15 LAB — POCT CBC
Granulocyte percent: 64.9 %G (ref 37–80)
HEMATOCRIT: 44.8 % (ref 43.5–53.7)
Hemoglobin: 15.8 g/dL (ref 14.1–18.1)
LYMPH, POC: 1.7 (ref 0.6–3.4)
MCH, POC: 31.6 pg — AB (ref 27–31.2)
MCHC: 35.2 g/dL (ref 31.8–35.4)
MCV: 89.7 fL (ref 80–97)
MID (CBC): 0.5 (ref 0–0.9)
MPV: 7.2 fL (ref 0–99.8)
POC GRANULOCYTE: 4 (ref 2–6.9)
POC LYMPH %: 27.6 % (ref 10–50)
POC MID %: 7.5 % (ref 0–12)
Platelet Count, POC: 160 10*3/uL (ref 142–424)
RBC: 4.99 M/uL (ref 4.69–6.13)
RDW, POC: 12.7 %
WBC: 6.1 10*3/uL (ref 4.6–10.2)

## 2016-01-15 LAB — HEPATITIS C ANTIBODY: HCV AB: NEGATIVE

## 2016-01-15 LAB — LIPID PANEL
CHOL/HDL RATIO: 5.5 ratio — AB (ref <=5.0)
CHOLESTEROL: 181 mg/dL (ref 125–200)
HDL: 33 mg/dL — ABNORMAL LOW (ref >=40)
LDL CALC: 112 mg/dL (ref <130)
TRIGLYCERIDES: 178 mg/dL — AB (ref <150)
VLDL: 36 mg/dL — AB (ref <30)

## 2016-01-15 LAB — URIC ACID: Uric Acid, Serum: 8 mg/dL (ref 4.0–8.0)

## 2016-01-15 LAB — TSH: TSH: 0.69 m[IU]/L (ref 0.40–4.50)

## 2016-01-15 MED ORDER — SILDENAFIL CITRATE 20 MG PO TABS: ORAL_TABLET | ORAL | Status: DC

## 2016-01-15 MED ORDER — LOSARTAN POTASSIUM 50 MG PO TABS
50.0000 mg | ORAL_TABLET | Freq: Every day | ORAL | Status: DC
Start: 2016-01-15 — End: 2022-04-25

## 2016-01-15 MED ORDER — HYDROCODONE-ACETAMINOPHEN 5-325 MG PO TABS
ORAL_TABLET | ORAL | Status: DC
Start: 2016-01-15 — End: 2018-06-17

## 2016-01-15 NOTE — Progress Notes (Signed)
Patient ID: [name], male   DOB: 11-30-1955, 60 y.o.   MRN: [id number]    By signing my name below, I, Essence Howell, attest that this documentation has been prepared under the direction and in the presence of [name], MD Electronically Signed: Ladene Artist, ED Scribe 01/15/2016 at 11:34 AM.  Chief Complaint:  Chief Complaint  Patient presents with  . Annual Exam   HPI: [name] is a 60 y.o. male who [facility] to [facility] today for an annual exam. Pt states that he is doing well overall. He is still working and riding horses. 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   HTN Pt reports elevated BP readings; triage BP: 144/92.   Dermatology Pt has noticed a few skin spots on his left arm which he wishes to have evaluated by a dermatologist. Pt requests a referral to dermatology at this visit. He has been seen by Dr. Allyson Sabal in the past.   Cardiology  Pt states that he was followed by his cardiologist Dr. Einar Gip for a whole in his heart but states that he has been cleared.   Preventive Maintenance  Pt's colonoscopy is UTD. He has also received his shingles vaccination.   Past Medical History  Diagnosis Date  . Arthritis   . Heart murmur   . Hypertension     reviewed by Dr. Einar Gip, cleared for surg. on 01/20/2015, ECHO results present   . PFO with atrial septal aneurysm     large PFO v/s small secundum ASD (saw Dr. Einar Gip 12/2014)  . Complication of anesthesia     irreg. heartbeat during surgery for hip replacement 2010 (Dr. Einar Gip consulted for abnormal EKG with RBBB but found to be an old finding)   Past Surgical History  Procedure Laterality Date  . Joint replacement Bilateral 2008, 2010  . Inguinal hernia repair Bilateral 02/09/2015    Procedure: LAPAROSCOPIC BILATERAL INGUINAL HERNIA REPAIRS;  Surgeon: Donnie Mesa, MD;  Location: [place];  Service: General;  Laterality: Bilateral;  . Insertion  of mesh Bilateral 02/09/2015    Procedure: INSERTION OF MESH;  Surgeon: Donnie Mesa, MD;  Location: [facility];  Service: General;  Laterality: Bilateral;   Social History   Social History  . Marital Status: Married    Spouse Name: N/A  . Number of Children: N/A  . Years of Education: N/A   Occupational History  . Maintenance    Social History Main Topics  . Smoking status: Former Smoker -- 0.80 packs/day for 20 years    Types: Cigarettes    Quit date: 06/26/1996  . Smokeless tobacco: None  . Alcohol Use: Yes     Comment: once a week or less a beer  . Drug Use: No  . Sexual Activity:    Partners: Female   Other Topics Concern  . None   Social History Narrative   Married. Education: [employer].    Family History  Problem Relation Age of Onset  . Cancer Father     lung  . Cancer Sister     breast  . Heart disease Maternal Grandmother   . Cancer Paternal Grandmother    Allergies  Allergen Reactions  . Morphine And Related Itching   Prior to Admission medications   Medication Sig Start Date End Date Taking? Authorizing Provider  aspirin EC 81 MG tablet Take 81 mg by mouth daily.   Yes Historical Provider, MD  Cholecalciferol (VITAMIN D PO) Take by mouth daily.   Yes Historical Provider, MD  HYDROcodone-acetaminophen (NORCO/VICODIN) 5-325 MG tablet TAKE 1/2 TO 1 TABLET BY MOUTH AT BEDTIME FOR PAIN 08/28/15  Yes [name], MD  loratadine (CLARITIN) 10 MG tablet Take 10 mg by mouth daily.   Yes Historical Provider, MD  losartan (COZAAR) 50 MG tablet TAKE 1 TABLET BY MOUTH DAILY 11/29/15  Yes [name], MD  Multiple Vitamin (DAILY VITAMINS PO) Take by mouth.   Yes Historical Provider, MD  sildenafil (REVATIO) 20 MG tablet Take 2-3 tablets one hour prior to intercourse 12/03/14  Yes [name], MD   ROS: The patient denies fevers, chills, night sweats, unintentional weight loss, chest pain, palpitations, wheezing, dyspnea on exertion, nausea, vomiting, abdominal pain,  dysuria, hematuria, melena, numbness, weakness, or tingling.   All other systems have been reviewed and were otherwise negative with the exception of those mentioned in the HPI and as above.    PHYSICAL EXAM: Filed Vitals:   01/15/16 1029  BP: 144/92  Pulse: 68  Temp: 98.5 F (36.9 C)  Resp: 18   Body mass index is 32.65 kg/(m^2).  General: Alert, no acute distress HEENT:  Normocephalic, atraumatic, oropharynx patent. Eye: Juliette Mangle [facility] Cardiovascular:  Regular rate and rhythm, no rubs murmurs or gallops. No Carotid bruits, radial pulse intact. No pedal edema.  Respiratory: Clear to auscultation bilaterally. No wheezes, rales, or rhonchi. No cyanosis, no use of accessory musculature Abdominal: No organomegaly, abdomen is soft and non-tender, positive bowel sounds. No masses. Musculoskeletal: Gait intact. No edema, tenderness GU: BB size firm area superior lateral L lobe of the prostate  Skin: No rashes.  Neurologic: Facial musculature symmetric. Psychiatric: Patient acts appropriately throughout our interaction. Lymphatic: No cervical or submandibular lymphadenopathy  LABS: Results for orders placed or performed in visit on 01/15/16  POCT CBC  Result Value Ref Range   WBC 6.1 4.6 - 10.2 K/uL   Lymph, poc 1.7 0.6 - 3.4   POC LYMPH PERCENT 27.6 10 - 50 %L   MID (cbc) 0.5 0 - 0.9   POC MID % 7.5 0 - 12 %M   POC Granulocyte 4.0 2 - 6.9   Granulocyte percent 64.9 37 - 80 %G   RBC 4.99 4.69 - 6.13 M/uL   Hemoglobin 15.8 14.1 - 18.1 g/dL   HCT, POC 44.8 43.5 - 53.7 %   MCV 89.7 80 - 97 fL   MCH, POC 31.6 (A) 27 - 31.2 pg   MCHC 35.2 31.8 - 35.4 g/dL   RDW, POC 12.7 %   Platelet Count, POC 160 142 - 424 K/uL   MPV 7.2 0 - 99.8 fL  POCT urinalysis dipstick  Result Value Ref Range   Color, UA yellow yellow   Clarity, UA clear clear   Glucose, UA negative negative   Bilirubin, UA negative negative   Ketones, POC UA negative negative   Spec Grav, UA 1.015    Blood, UA  negative negative   pH, UA 7.0    Protein Ur, POC negative negative   Urobilinogen, UA 0.2    Nitrite, UA Negative Negative   Leukocytes, UA Negative Negative  POCT Microscopic Urinalysis ([facility])  Result Value Ref Range   WBC,UR,HPF,POC None None WBC/hpf   RBC,UR,HPF,POC None None RBC/hpf   Bacteria Few (A) None, Too numerous to count   Mucus Absent Absent   Epithelial Cells, UR Per Microscopy None None, Too numerous to count cells/hpf  EKG/XRAY:   Primary read interpreted by Dr. Everlene Farrier at [facility].  ASSESSMENT/PLAN:  Patient is going to monitor his blood pressure at home. All medications were refilled. He has had some toe pain and a uric acid was done. No other changes at the present time. He will be on a DASH diet.I personally performed the services described in this documentation, which was scribed in my presence. The recorded information has been reviewed and is accurate.   Gross sideeffects, risk and benefits, and alternatives of medications d/w patient. Patient is aware that all medications have potential sideeffects and we are unable to predict every sideeffect or drug-drug interaction that may occur.  [name] MD 01/15/2016 10:42 AM

## 2016-01-15 NOTE — Patient Instructions (Addendum)
   IF you received an x-ray today, you will receive an invoice from [facility]. Please contact [facility] at [phone] with questions or concerns regarding your invoice.   IF you received labwork today, you will receive an invoice from [facility]. Please contact [facility] at [phone] with questions or concerns regarding your invoice.   Our billing staff will not be able to assist you with questions regarding bills from these companies.  You will be contacted with the lab results as soon as they are available. The fastest way to get your results is to activate your My Chart account. Instructions are located on the last page of this paperwork. If you have not heard from us regarding the results in 2 weeks, please contact this office.     DASH Eating Plan DASH stands for "Dietary Approaches to Stop Hypertension." The DASH eating plan is a healthy eating plan that has been shown to reduce high blood pressure (hypertension). Additional health benefits may include reducing the risk of type 2 diabetes mellitus, heart disease, and stroke. The DASH eating plan may also help with weight loss. WHAT DO I NEED TO KNOW ABOUT THE DASH EATING PLAN? For the DASH eating plan, you will follow these general guidelines:  Choose foods with a percent daily value for sodium of less than 5% (as listed on the food label).  Use salt-free seasonings or herbs instead of table salt or sea salt.  Check with your health care provider or pharmacist before using salt substitutes.  Eat lower-sodium products, often labeled as "lower sodium" or "no salt added."  Eat fresh foods.  Eat more vegetables, fruits, and low-fat dairy products.  Choose whole grains. Look for the word "whole" as the first word in the ingredient list.  Choose fish and skinless chicken or turkey more often than red meat. Limit fish, poultry, and meat to 6 oz (170 g) each day.  Limit sweets,  desserts, sugars, and sugary drinks.  Choose heart-healthy fats.  Limit cheese to 1 oz (28 g) per day.  Eat more home-cooked food and less restaurant, buffet, and fast food.  Limit fried foods.  Cook foods using methods other than frying.  Limit canned vegetables. If you do use them, rinse them well to decrease the sodium.  When eating at a restaurant, ask that your food be prepared with less salt, or no salt if possible. WHAT FOODS CAN I EAT? Seek help from a dietitian for individual calorie needs. Grains Whole grain or whole wheat bread. Brown rice. Whole grain or whole wheat pasta. Quinoa, bulgur, and whole grain cereals. Low-sodium cereals. Corn or whole wheat flour tortillas. Whole grain cornbread. Whole grain crackers. Low-sodium crackers. Vegetables Fresh or frozen vegetables (raw, steamed, roasted, or grilled). Low-sodium or reduced-sodium tomato and vegetable juices. Low-sodium or reduced-sodium tomato sauce and paste. Low-sodium or reduced-sodium canned vegetables.  Fruits All fresh, canned (in natural juice), or frozen fruits. Meat and Other Protein Products Ground beef (85% or leaner), grass-fed beef, or beef trimmed of fat. Skinless chicken or turkey. Ground chicken or turkey. Pork trimmed of fat. All fish and seafood. Eggs. Dried beans, peas, or lentils. Unsalted nuts and seeds. Unsalted canned beans. Dairy Low-fat dairy products, such as skim or 1% milk, 2% or reduced-fat cheeses, low-fat ricotta or cottage cheese, or plain low-fat yogurt. Low-sodium or reduced-sodium cheeses. Fats and Oils Tub margarines without trans fats. Light or reduced-fat mayonnaise and salad dressings (reduced sodium). Avocado. Safflower, olive, or canola   oils. Natural peanut or almond butter. Other Unsalted popcorn and pretzels. The items listed above may not be a complete list of recommended foods or beverages. Contact your dietitian for more options. WHAT FOODS ARE NOT  RECOMMENDED? Grains White bread. White pasta. White rice. Refined cornbread. Bagels and croissants. Crackers that contain trans fat. Vegetables Creamed or fried vegetables. Vegetables in a cheese sauce. Regular canned vegetables. Regular canned tomato sauce and paste. Regular tomato and vegetable juices. Fruits Dried fruits. Canned fruit in light or heavy syrup. Fruit juice. Meat and Other Protein Products Fatty cuts of meat. Ribs, chicken wings, bacon, sausage, bologna, salami, chitterlings, fatback, hot dogs, bratwurst, and packaged luncheon meats. Salted nuts and seeds. Canned beans with salt. Dairy Whole or 2% milk, cream, half-and-half, and cream cheese. Whole-fat or sweetened yogurt. Full-fat cheeses or blue cheese. Nondairy creamers and whipped toppings. Processed cheese, cheese spreads, or cheese curds. Condiments Onion and garlic salt, seasoned salt, table salt, and sea salt. Canned and packaged gravies. Worcestershire sauce. Tartar sauce. Barbecue sauce. Teriyaki sauce. Soy sauce, including reduced sodium. Steak sauce. Fish sauce. Oyster sauce. Cocktail sauce. Horseradish. Ketchup and mustard. Meat flavorings and tenderizers. Bouillon cubes. Hot sauce. Tabasco sauce. Marinades. Taco seasonings. Relishes. Fats and Oils Butter, stick margarine, lard, shortening, ghee, and bacon fat. Coconut, palm kernel, or palm oils. Regular salad dressings. Other Pickles and olives. Salted popcorn and pretzels. The items listed above may not be a complete list of foods and beverages to avoid. Contact your dietitian for more information. WHERE CAN I FIND MORE INFORMATION? National Heart, Lung, and Blood Institute: www.nhlbi.nih.gov/health/health-topics/topics/dash/   This information is not intended to replace advice given to you by your health care provider. Make sure you discuss any questions you have with your health care provider.   Document Released: 06/01/2011 Document Revised: 07/03/2014  Document Reviewed: 04/16/2013 Elsevier Interactive Patient Education 2016 [employer]  

## 2016-01-17 LAB — PSA: PSA: 0.75 ng/mL (ref <=4.00)

## 2016-01-19 ENCOUNTER — Telehealth: Payer: Self-pay | Admitting: Emergency Medicine

## 2016-01-19 ENCOUNTER — Other Ambulatory Visit: Payer: Self-pay | Admitting: Emergency Medicine

## 2016-01-19 MED ORDER — ALLOPURINOL 100 MG PO TABS: 100.0000 mg | ORAL_TABLET | Freq: Every day | ORAL | 6 refills | Status: DC

## 2016-01-19 NOTE — Telephone Encounter (Signed)
-----   Message from [name], MD sent at 01/16/2016  9:12 AM EDT ----- Uric acid is upper limits of normal at 8.0. Would advise we start low-dose allopurinol at 100 mg a day if he is willing to do this call-in allopurinol 100 mg. #30 tablets. Refill one year. Cholesterol and LDL are essentially the same. His triglycerides are high with a low HDL. This will improve with continued weight loss and diet.

## 2016-03-20 ENCOUNTER — Telehealth: Payer: Self-pay

## 2016-03-20 NOTE — Telephone Encounter (Signed)
Pt requesting Colchicine for his chronic gout?   Best phone [phone]

## 2016-03-21 ENCOUNTER — Other Ambulatory Visit: Payer: Self-pay | Admitting: Emergency Medicine

## 2016-03-21 ENCOUNTER — Ambulatory Visit: Payer: Self-pay | Admitting: Physician Assistant

## 2016-03-21 MED ORDER — COLCHICINE 0.6 MG PO TABS: ORAL_TABLET | ORAL | 1 refills | Status: DC

## 2016-03-21 NOTE — Telephone Encounter (Signed)
Dr Everlene Farrier, I didn't see that you have Rxd this for pt before, only allopurinol, but wasn't sure if you had discussed putting pt on this med? Please advise if you need to see pt again first and if you want him taking colchicine.

## 2016-03-21 NOTE — Telephone Encounter (Signed)
Call patient and let him now I sent in medications for his gout flare.

## 2016-04-01 IMAGING — CT CT ABD-PELV W/ CM
3 of 5 series · 13 of 36 positions shown, 19 images · IV contrast (iopamidol)
Comparison: None.

CLINICAL DATA: Mass in right inguinal region for 5 months. Mass in
left inguinal region.

EXAM:
CT ABDOMEN AND PELVIS WITH CONTRAST
TECHNIQUE: Multidetector CT imaging of the abdomen and pelvis was performed
using the standard protocol following bolus administration of
intravenous contrast.
CONTRAST:  125mL 0XP9NS-CQQ IOPAMIDOL (0XP9NS-CQQ) INJECTION 61%

[Series 3: abd/pelvis with · axial · 0.83mm/px · z∈[-396,-56]mm · 7 of 92 slices shown, 12 images]
[im 12/92  soft-tissue]
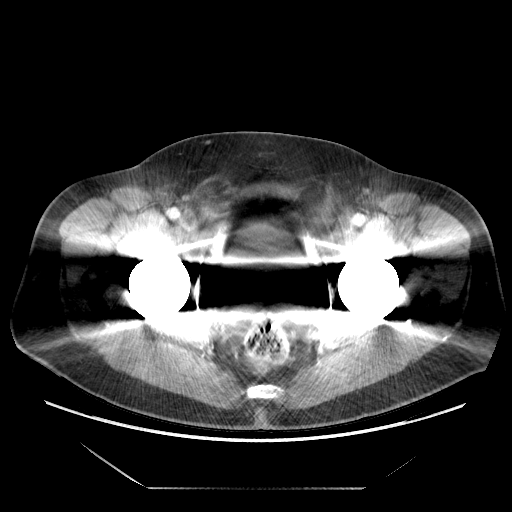
[im 12/92  bone]
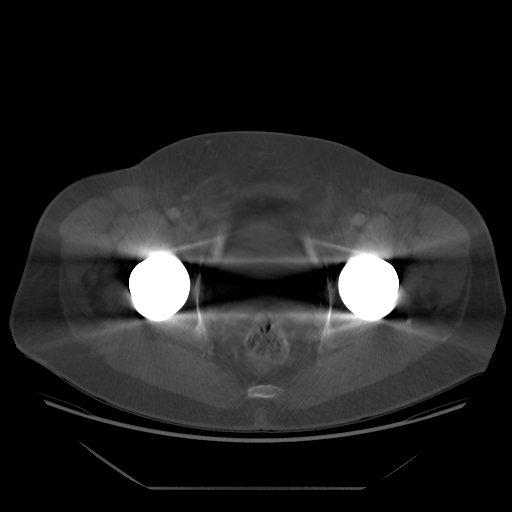
[im 23/92  soft-tissue]
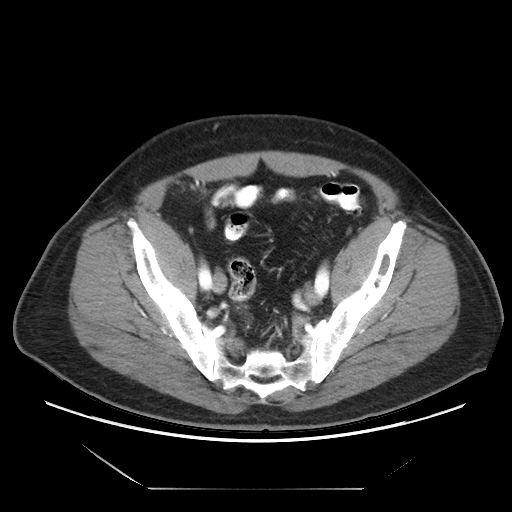
[im 35/92  soft-tissue]
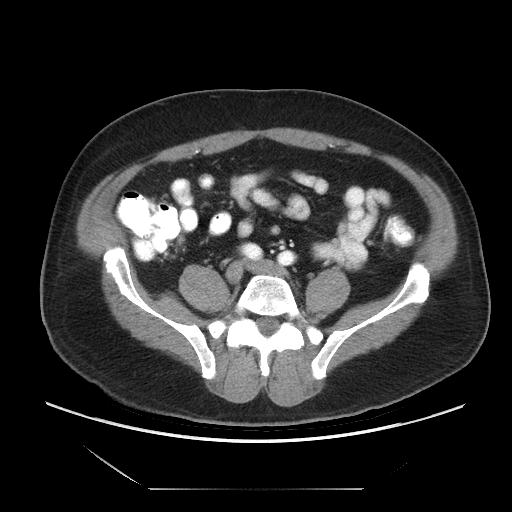
[im 46/92  soft-tissue]
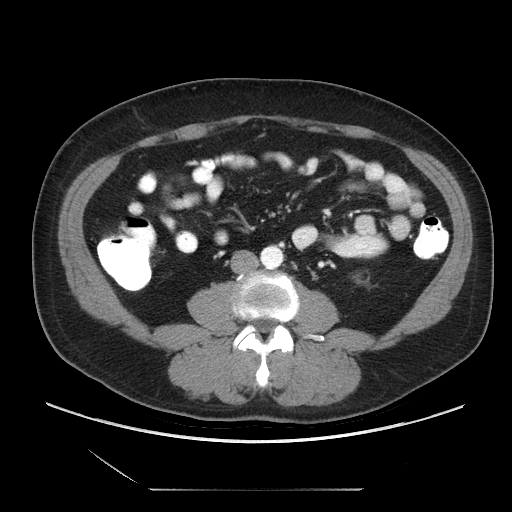
[im 46/92  lung]
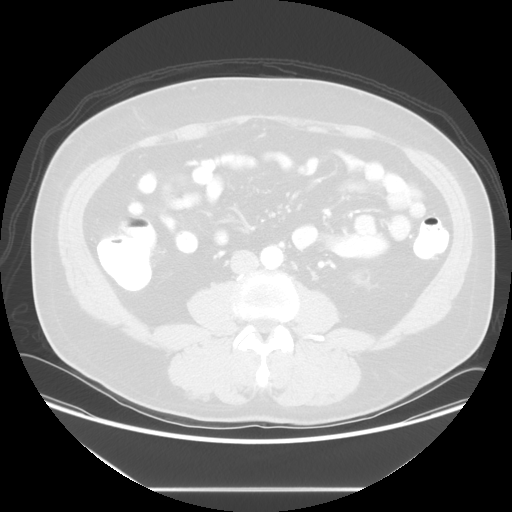
[im 57/92  soft-tissue]
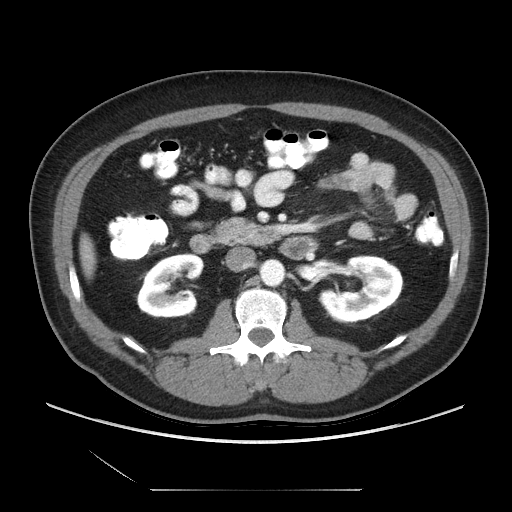
[im 57/92  lung]
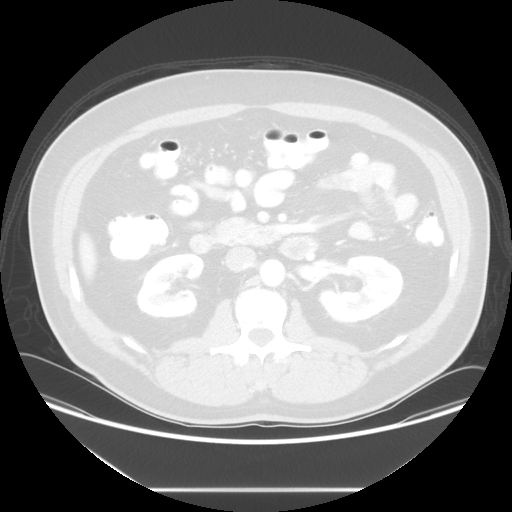
[im 69/92  soft-tissue]
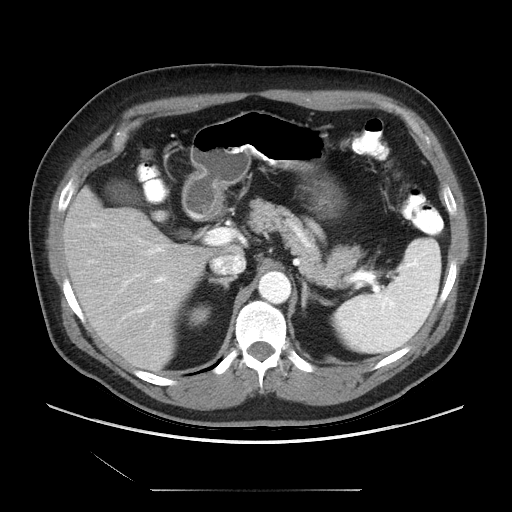
[im 69/92  lung]
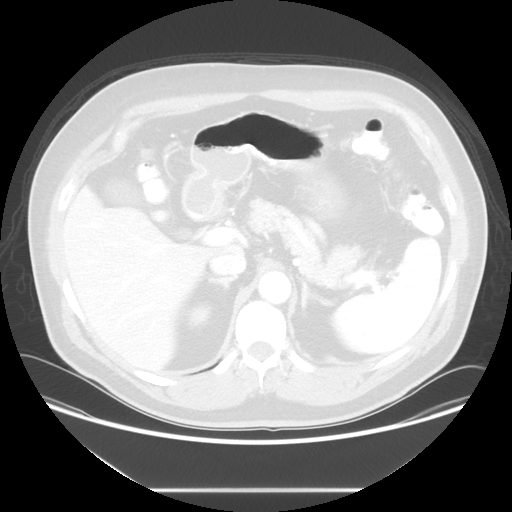
[im 80/92  soft-tissue]
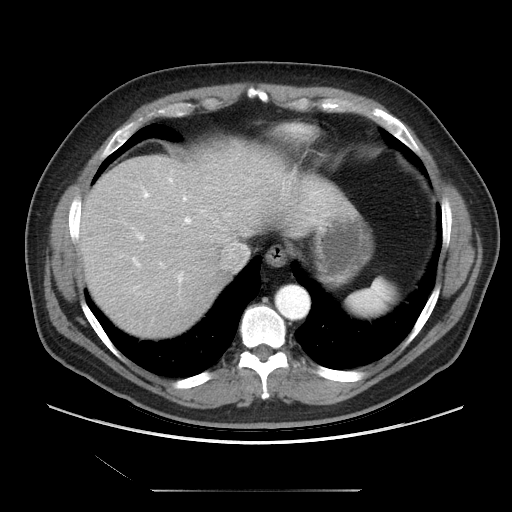
[im 80/92  lung]
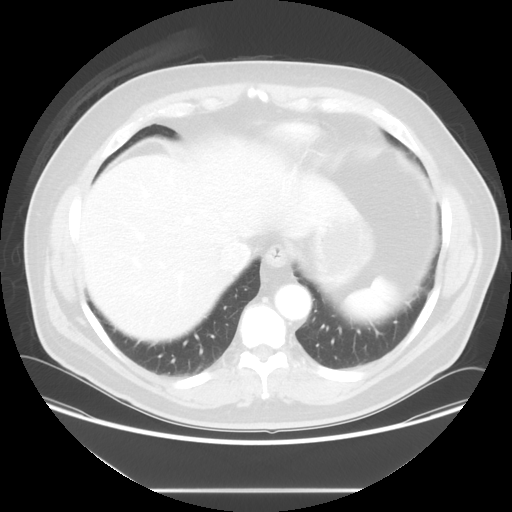

[Series 601: coronal body · coronal · 0.96mm/px · 1 of 127 slices shown, 2 images]
[im 43/127  soft-tissue]
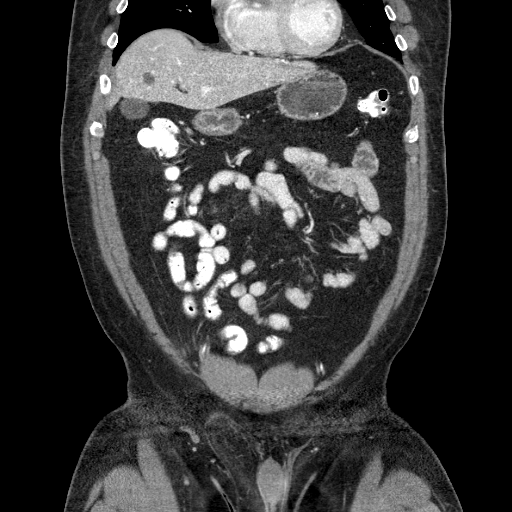
[im 43/127  bone]
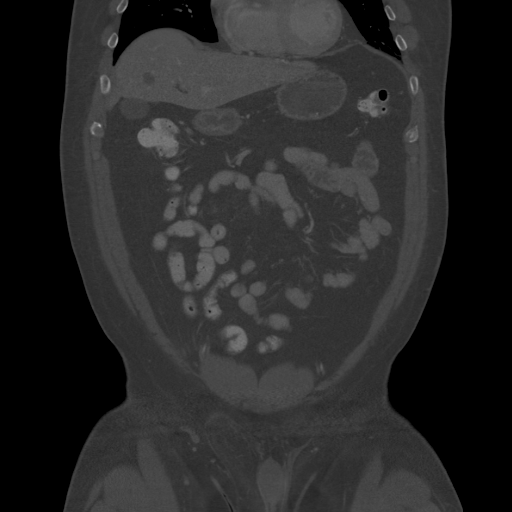

[Series 602: sagittal body · sagittal · 0.96mm/px · 5 of 168 slices shown]
[im 11/168  soft-tissue]
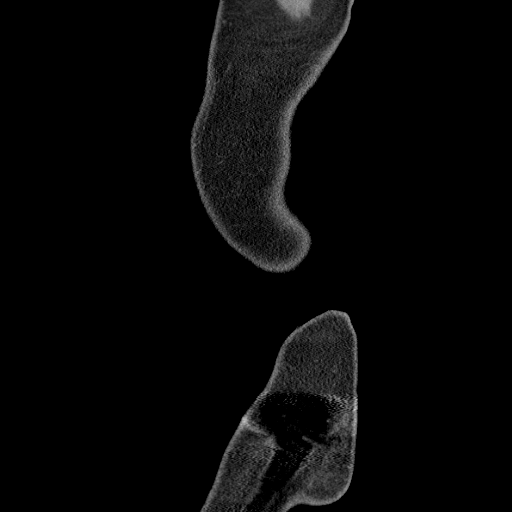
[im 32/168  soft-tissue]
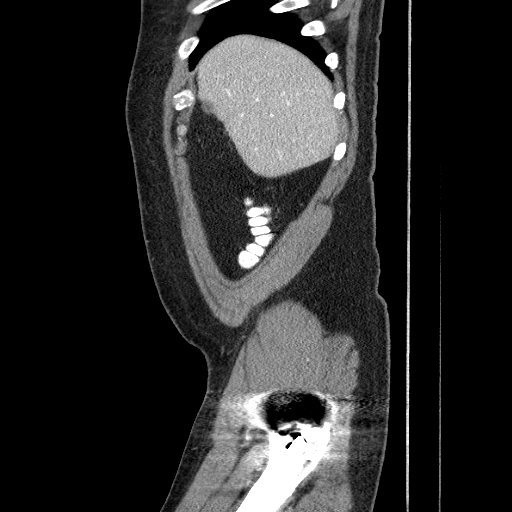
[im 53/168  soft-tissue]
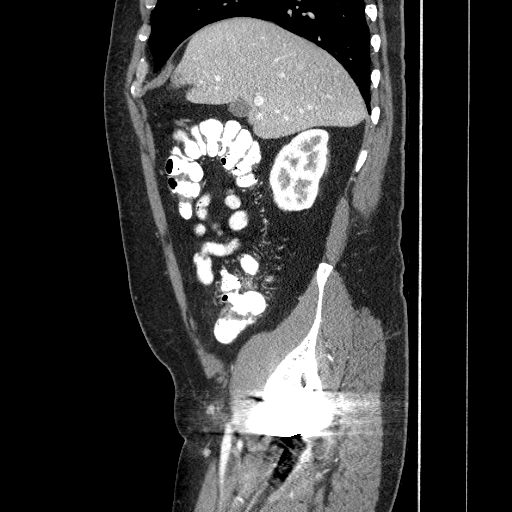
[im 74/168  soft-tissue]
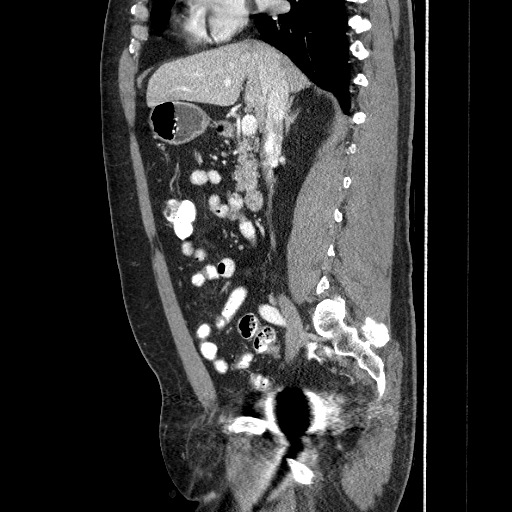
[im 94/168  soft-tissue]
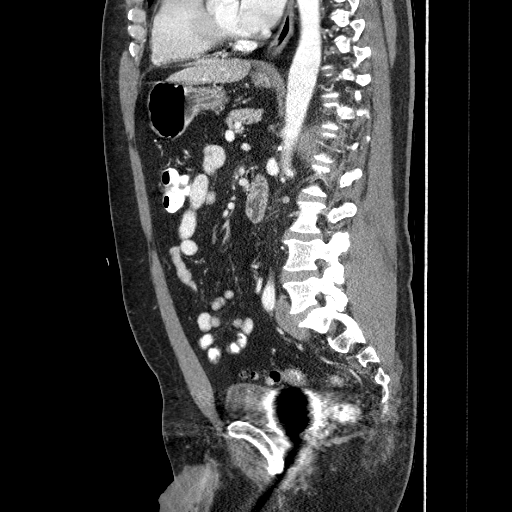

[13 of 36 positions shown; findings below may reference images not displayed]

FINDINGS: Lower chest: Lung bases are clear. No effusions. Heart is normal
size.

Hepatobiliary: Small hypodensity in the right hepatic lobe
compatible with a small cyst. Gallbladder unremarkable.

Pancreas: No focal abnormality or ductal dilatation.

Spleen: No focal abnormality.  Normal size.

Adrenals/Urinary Tract: No focal renal or adrenal abnormality. No
hydronephrosis. Urinary bladder is unremarkable.

Stomach/Bowel: Descending colonic and sigmoid diverticulosis. No
active diverticulitis. Stomach and small bowel are decompressed and
unremarkable.

Vascular/Lymphatic: No retroperitoneal or mesenteric adenopathy.
Aorta normal caliber.

Reproductive: No mass or other significant abnormality.

Other: No free fluid or free air. There is scratch head there are
bilateral inguinal hernias containing fat, right larger than left.

Musculoskeletal: Bilateral hip replacements noted which causes beam
hardening artifact in the lower pelvis. No acute bony abnormality.
IMPRESSION: Bilateral inguinal hernias containing fat, right larger than left.

## 2016-05-22 DIAGNOSIS — M79671 Pain in right foot: Secondary | ICD-10-CM | POA: Diagnosis not present

## 2016-05-22 DIAGNOSIS — M10071 Idiopathic gout, right ankle and foot: Secondary | ICD-10-CM | POA: Diagnosis not present

## 2016-11-03 ENCOUNTER — Telehealth: Payer: Self-pay

## 2016-11-03 NOTE — Telephone Encounter (Signed)
PATIENT WOULD LIKE TO GET A REFILL ON HIS HYDROCODONE-ACETAMINOPHEN 5-325 MG. DR. Everlene Farrier WAS HIS PRIMARY CARE PHYSICIAN BEFORE HE RETIRED. HE TAKES THIS WHEN EVER NEEDED FOR HIS 2 HIP REPLACEMENTS. HE IS NOT DUE FOR HIS ANNUAL PHYSICAL UNTIL July 2018 AND HE WILL FIND A NEW PCP IN [place] WHICH IS CLOSER TO WHERE HE WORKS. BEST PHONE [phone] (CELL) [place]. [place]

## 2016-11-03 NOTE — Telephone Encounter (Signed)
Placed call to patient regarding his request for medication refill. Advised patient that due to the nature of this medication, an office visit is required. Patient declined to make an appointment at this time, states that he wishes to find PCP closer to his employer. Patient thanked Probation officer for the call, denied any further needs at this time. [place]

## 2016-11-24 ENCOUNTER — Encounter: Payer: Self-pay | Admitting: Physician Assistant

## 2016-11-24 ENCOUNTER — Ambulatory Visit: Payer: Self-pay | Admitting: Physician Assistant

## 2016-11-24 VITALS — BP 148/90 | HR 81 | Temp 98.6°F

## 2016-11-24 DIAGNOSIS — J0100 Acute maxillary sinusitis, unspecified: Secondary | ICD-10-CM

## 2016-11-24 MED ORDER — FLUTICASONE PROPIONATE 50 MCG/ACT NA SUSP: 2.0000 | Freq: Every day | NASAL | 6 refills | Status: DC

## 2016-11-24 MED ORDER — AZITHROMYCIN 250 MG PO TABS: ORAL_TABLET | ORAL | 0 refills | Status: DC

## 2016-11-24 NOTE — Progress Notes (Signed)
S: C/o runny nose and congestion for 4 days, no fever, chills, cp/sob, v/d; mucus is green and thick, cough is sporadic, c/o of facial and dental pain.  Feels "swimmy headed"  Using otc meds: sudafed, claritin  O: PE: vitals wnl, nad, perrl eomi, normocephalic, tms dull, nasal mucosa red and swollen, throat injected, neck supple no lymph, lungs c t a, cv rrr, neuro intact  A:  Acute sinusitis   P: drink fluids, continue regular meds , use otc meds of choice, return if not improving in 5 days, return earlier if worsening, zpack, flonase

## 2017-01-04 DIAGNOSIS — H52203 Unspecified astigmatism, bilateral: Secondary | ICD-10-CM | POA: Diagnosis not present

## 2017-01-04 DIAGNOSIS — H524 Presbyopia: Secondary | ICD-10-CM | POA: Diagnosis not present

## 2017-01-04 DIAGNOSIS — H5203 Hypermetropia, bilateral: Secondary | ICD-10-CM | POA: Diagnosis not present

## 2017-02-07 DIAGNOSIS — I10 Essential (primary) hypertension: Secondary | ICD-10-CM | POA: Diagnosis not present

## 2017-02-07 DIAGNOSIS — N528 Other male erectile dysfunction: Secondary | ICD-10-CM | POA: Diagnosis not present

## 2017-02-07 DIAGNOSIS — E669 Obesity, unspecified: Secondary | ICD-10-CM | POA: Diagnosis not present

## 2017-02-07 DIAGNOSIS — Z0000 Encounter for general adult medical examination without abnormal findings: Secondary | ICD-10-CM | POA: Diagnosis not present

## 2017-05-03 DIAGNOSIS — D225 Melanocytic nevi of trunk: Secondary | ICD-10-CM | POA: Diagnosis not present

## 2017-05-03 DIAGNOSIS — D1801 Hemangioma of skin and subcutaneous tissue: Secondary | ICD-10-CM | POA: Diagnosis not present

## 2017-05-03 DIAGNOSIS — L820 Inflamed seborrheic keratosis: Secondary | ICD-10-CM | POA: Diagnosis not present

## 2017-05-03 DIAGNOSIS — L918 Other hypertrophic disorders of the skin: Secondary | ICD-10-CM | POA: Diagnosis not present

## 2017-05-03 DIAGNOSIS — L821 Other seborrheic keratosis: Secondary | ICD-10-CM | POA: Diagnosis not present

## 2017-06-13 DIAGNOSIS — I10 Essential (primary) hypertension: Secondary | ICD-10-CM | POA: Diagnosis not present

## 2017-06-13 DIAGNOSIS — Z0000 Encounter for general adult medical examination without abnormal findings: Secondary | ICD-10-CM | POA: Diagnosis not present

## 2017-06-13 DIAGNOSIS — R972 Elevated prostate specific antigen [PSA]: Secondary | ICD-10-CM | POA: Diagnosis not present

## 2017-06-20 DIAGNOSIS — Z0000 Encounter for general adult medical examination without abnormal findings: Secondary | ICD-10-CM | POA: Diagnosis not present

## 2017-06-20 DIAGNOSIS — E669 Obesity, unspecified: Secondary | ICD-10-CM | POA: Diagnosis not present

## 2017-06-20 DIAGNOSIS — I10 Essential (primary) hypertension: Secondary | ICD-10-CM | POA: Diagnosis not present

## 2017-12-12 DIAGNOSIS — I10 Essential (primary) hypertension: Secondary | ICD-10-CM | POA: Diagnosis not present

## 2017-12-19 DIAGNOSIS — N289 Disorder of kidney and ureter, unspecified: Secondary | ICD-10-CM | POA: Diagnosis not present

## 2017-12-19 DIAGNOSIS — E669 Obesity, unspecified: Secondary | ICD-10-CM | POA: Diagnosis not present

## 2017-12-19 DIAGNOSIS — M19012 Primary osteoarthritis, left shoulder: Secondary | ICD-10-CM | POA: Diagnosis not present

## 2017-12-19 DIAGNOSIS — M25512 Pain in left shoulder: Secondary | ICD-10-CM | POA: Diagnosis not present

## 2017-12-19 DIAGNOSIS — I10 Essential (primary) hypertension: Secondary | ICD-10-CM | POA: Diagnosis not present

## 2017-12-24 DIAGNOSIS — N289 Disorder of kidney and ureter, unspecified: Secondary | ICD-10-CM | POA: Diagnosis not present

## 2018-01-07 DIAGNOSIS — N289 Disorder of kidney and ureter, unspecified: Secondary | ICD-10-CM | POA: Diagnosis not present

## 2018-01-07 DIAGNOSIS — H5203 Hypermetropia, bilateral: Secondary | ICD-10-CM | POA: Diagnosis not present

## 2018-01-07 DIAGNOSIS — H524 Presbyopia: Secondary | ICD-10-CM | POA: Diagnosis not present

## 2018-01-07 DIAGNOSIS — H52203 Unspecified astigmatism, bilateral: Secondary | ICD-10-CM | POA: Diagnosis not present

## 2018-01-29 ENCOUNTER — Ambulatory Visit: Payer: 59 | Attending: Family Medicine

## 2018-01-29 DIAGNOSIS — M62838 Other muscle spasm: Secondary | ICD-10-CM | POA: Diagnosis not present

## 2018-01-29 DIAGNOSIS — M25612 Stiffness of left shoulder, not elsewhere classified: Secondary | ICD-10-CM | POA: Insufficient documentation

## 2018-01-29 DIAGNOSIS — M25512 Pain in left shoulder: Secondary | ICD-10-CM | POA: Insufficient documentation

## 2018-01-29 DIAGNOSIS — G8929 Other chronic pain: Secondary | ICD-10-CM | POA: Diagnosis not present

## 2018-01-30 ENCOUNTER — Other Ambulatory Visit: Payer: Self-pay

## 2018-01-30 NOTE — Addendum Note (Signed)
Addended by: Blain Pais on: 01/30/2018 05:48 PM   Modules accepted: Orders

## 2018-01-30 NOTE — Therapy (Signed)
[facility] [address] [place], [address] Phone: [phone]   Fax:  [phone]  Physical Therapy Treatment  Patient Details  Name: [name] MRN: [id number] Date of Birth: 11-09-1955 Referring Provider: Dion Body, MD     Encounter Date: 01/29/2018  PT End of Session - 01/30/18 0929    Visit Number  1    Number of Visits  13    Date for PT Re-Evaluation  03/13/18    PT Start Time  1600    PT Stop Time  1700    PT Time Calculation (min)  60 min    Activity Tolerance  Patient tolerated treatment well    Behavior During Therapy  [facility] for tasks assessed/performed       Past Medical History:  Diagnosis Date  . Arthritis   . Complication of anesthesia    irreg. heartbeat during surgery for hip replacement 2010 (Dr. Einar Gip consulted for abnormal EKG with RBBB but found to be an old finding)  . Heart murmur   . Hypertension    reviewed by Dr. Einar Gip, cleared for surg. on 01/20/2015, ECHO results present   . PFO with atrial septal aneurysm    large PFO v/s small secundum ASD (saw Dr. Einar Gip 12/2014)    Past Surgical History:  Procedure Laterality Date  . INGUINAL HERNIA REPAIR Bilateral 02/09/2015   Procedure: LAPAROSCOPIC BILATERAL INGUINAL HERNIA REPAIRS;  Surgeon: Donnie Mesa, MD;  Location: [place];  Service: General;  Laterality: Bilateral;  . INSERTION OF MESH Bilateral 02/09/2015   Procedure: INSERTION OF MESH;  Surgeon: Donnie Mesa, MD;  Location: [place];  Service: General;  Laterality: Bilateral;  . JOINT REPLACEMENT Bilateral 2008, 2010    There were no vitals filed for this visit.  Subjective Assessment - 01/30/18 1235    Subjective  Pt reports that in January he was chopping logs with a friend and had to catch the heavy logs and stack them, after this he began to notice L shoulder pain.  Pt does not report any chest pain, palpitations or SOB. Pt states that overall the pain has  gotten better since January but that he continues to have sharp pain with certain movements. worst pain 8/10, best pain 0/10. 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 No N/T. Occasionally wakes at night when sleeping on L side but is able to fall back asleep. Pt has not tried any modalities or treatments to ease pain and pt reports that he is unable to take pain relievers due to previous medical event. Specifically, pt reports that he experiences pain when lifting his horse saddle onto his horse and when reaching to turn on/off the bedside lamp.    Pertinent History  B THA    Patient Stated Goals  decr pain, be able to lift saddle with no pain,     Currently in Pain?  No/denies    Pain Frequency  Intermittent    Aggravating Factors   reaching arm out to side (abduction), bringing saddle onto horse         [facility] PT Assessment - 01/30/18 0001      Assessment   Medical Diagnosis  Left shoulder pain    Referring Provider  Dion Body, MD      Onset Date/Surgical Date  06/26/17    Hand Dominance  Left    Prior Therapy  yes B  THA       Balance Screen   Has the patient fallen in the past 6 months  No    Has the patient had a decrease in activity level because of a fear of falling?   No    Is the patient reluctant to leave their home because of a fear of falling?   No      Home Film/video editor residence    Living Arrangements  Spouse/significant other      Prior Function   Level of Independence  Independent      Cognition   Overall Cognitive Status  Within Functional Limits for tasks assessed      Observation/Other Assessments   Focus on Therapeutic Outcomes (FOTO)   59      Posture/Postural Control   Posture/Postural Control  Postural limitations    Postural Limitations  Rounded Shoulders;Forward head      ROM / Strength   AROM / PROM / Strength  AROM;PROM;Strength      AROM   Overall AROM    Deficits    Overall AROM Comments  R shoulder WFL; L flexion 150 deg, L abd 150deg with pain starting at 90deg, L ER 45 and painful, L IR 55 deg      PROM   Overall PROM   Deficits    Overall PROM Comments  L flexion full PROM, L abd 125 first resistance and painful but able to achieve 150 (equal to AROM)      Strength   Overall Strength  Deficits    Strength Assessment Site  Shoulder    Right/Left Shoulder  Left    Left Shoulder Flexion  5/5    Left Shoulder Extension  5/5    Left Shoulder ABduction  4-/5 pain    Left Shoulder Internal Rotation  5/5    Left Shoulder External Rotation  4/5 pain    Left Shoulder Horizontal ABduction  5/5      Palpation   Palpation comment  AP hypomobility of L glenohumeral joint, tenderness to palpation along teres minor, infraspinatus, posterior delt and proximal triceps tendon      Special Tests    Special Tests  --    Other special tests  Scour test neg on L, biceps load test neg on L        TREATMENT Therapeutic Exercises Scapular retractions 2x10 (significant verbal and tactile cueing required for proper form, improved with practice; added to HEP) ER with YTB resistance 2x10 (verbal cueing for proper technique, added to HEP)  Pt demonstrates no increase in pain at end of treatment.     PT Education - 01/30/18 [id number]    Education Details  Patient educated on PT plan of care. Pt also educated on HEP (ER with YTB and scapular retractions)    Person(s) Educated  Patient    Methods  Explanation;Demonstration;Handout    Comprehension  Verbalized understanding;Returned demonstration          PT Long Term Goals - 01/30/18 0947      PT LONG TERM GOAL #1   Title  Pt will be independent and compliant with HEP in order to maintain gains made in physical therapy and return to prior level of function.    Baseline  01/29/18: dependent with form and technique    Time  4    Period  Weeks    Status  New    Target Date  02/27/18  PT LONG  TERM GOAL #2   Title  Pt will report worse pain in past week less than 6/10 in order to return to prior level of function.    Baseline  01/29/18: 8/10    Time  4    Period  Weeks    Status  New    Target Date  02/27/18      PT LONG TERM GOAL #3   Title  Pt will report worse pain in past week less than 4/10 in order to return to prior level of function.    Baseline  01/29/18: 8/10    Time  6    Period  Weeks    Status  New    Target Date  03/13/18      PT LONG TERM GOAL #4   Title  Pt will report no pain while lifting saddle up onto horse in order to return to prior level of function and improve quality of life.    Baseline  01/29/18: pt reports pain while lifting saddle onto horse    Time  6    Period  Weeks    Status  New    Target Date  03/13/18      PT LONG TERM GOAL #5   Title  Pt will improve FOTO score from 59 to 74 in order to demonstrate functional improvements and improve quality of life.     Baseline  01/29/18: 59    Time  6    Period  Weeks    Status  New    Target Date  03/13/18            Plan - 01/30/18 1248    Clinical Impression Statement  Pt is a 62 y.o L hand dominant male with increased shoulder pain starting approximately 8 months ago s/p catching and chopping logs. Pt presents with increased L shoulder dysfunction indicated by increased L shoulder pain, decreased L shoulder AROM, hypomobility of L glenohumeral joint, muscle spasms/pain along L teres minor, infraspinatus, posterior deltoid and proximal triceps. Pt also demonstrates poor posture including rounded shoulders and forward head. Pt will benefit from skilled therapy in order to return to prior level of function.    Clinical Presentation  Stable    Clinical Decision Making  Low    Rehab Potential  Good    Clinical Impairments Affecting Rehab Potential  (+) motivation, age     PT Frequency  2x / week    PT Duration  6 weeks    PT Treatment/Interventions  ADLs/Self Care Home Management;Aquatic  Therapy;Biofeedback;Cryotherapy;Air traffic controller;Therapeutic exercise;Therapeutic activities;Functional mobility training;Patient/family education;Manual techniques;Passive range of motion;Dry needling    PT Next Visit Plan  STM, shoulder mobs, stabilization exercises, review HEP    PT Home Exercise Plan  resisted ER with YTB, scapular retractions     Consulted and Agree with Plan of Care  Patient       Patient will benefit from skilled therapeutic intervention in order to improve the following deficits and impairments:  Hypomobility, Decreased mobility, Increased muscle spasms, Decreased range of motion, Improper body mechanics, Postural dysfunction, Impaired UE functional use, Pain  Visit Diagnosis: Chronic left shoulder pain  Stiffness of left shoulder, not elsewhere classified  Other muscle spasm     Problem List Patient Active Problem List   Diagnosis Date Noted  . Inguinal hernia 12/03/2014  . Arthritis 11/05/2012  . Erectile dysfunction 11/05/2012  . Unspecified essential hypertension 11/05/2012    Georg Ruddle, SPT  01/30/2018, 12:52 PM  [facility] [address] [place], [address] Phone: [phone]   Fax:  [phone]  Name: [name] MRN: [id number] Date of Birth: [birth year]

## 2018-02-05 ENCOUNTER — Ambulatory Visit: Payer: 59

## 2018-02-05 DIAGNOSIS — M25612 Stiffness of left shoulder, not elsewhere classified: Secondary | ICD-10-CM

## 2018-02-05 DIAGNOSIS — G8929 Other chronic pain: Secondary | ICD-10-CM

## 2018-02-05 DIAGNOSIS — M62838 Other muscle spasm: Secondary | ICD-10-CM

## 2018-02-05 DIAGNOSIS — M25512 Pain in left shoulder: Principal | ICD-10-CM

## 2018-02-06 NOTE — Therapy (Signed)
This entire session was performed under the direct supervision of a liscensed physical therapist.  11:18 AM, 02/06/18 Etta Grandchild, PT, DPT Physical Therapist - [place] [phone] (Office)       [place] [facility] [address] [place], [address] Phone: [phone]   Fax:  [phone]  Physical Therapy Treatment  Patient Details  Name: [name] MRN: [id number] Date of Birth: 01-19-1956 Referring Provider: Dion Body, MD     Encounter Date: 02/05/2018    Past Medical History:  Diagnosis Date  . Arthritis   . Complication of anesthesia    irreg. heartbeat during surgery for hip replacement 2010 (Dr. Einar Gip consulted for abnormal EKG with RBBB but found to be an old finding)  . Heart murmur   . Hypertension    reviewed by Dr. Einar Gip, cleared for surg. on 01/20/2015, ECHO results present   . PFO with atrial septal aneurysm    large PFO v/s small secundum ASD (saw Dr. Einar Gip 12/2014)    Past Surgical History:  Procedure Laterality Date  . INGUINAL HERNIA REPAIR Bilateral 02/09/2015   Procedure: LAPAROSCOPIC BILATERAL INGUINAL HERNIA REPAIRS;  Surgeon: Donnie Mesa, MD;  Location: [place];  Service: General;  Laterality: Bilateral;  . INSERTION OF MESH Bilateral 02/09/2015   Procedure: INSERTION OF MESH;  Surgeon: Donnie Mesa, MD;  Location: [place];  Service: General;  Laterality: Bilateral;  . JOINT REPLACEMENT Bilateral 2008, 2010    There were no vitals filed for this visit.  Subjective Assessment - 02/06/18 0759    Subjective  Pt reports that he has been ocmpleting his HEP daily and that he feels that it has provided some relief in the movements that he previously reported increased his pain. Pt also states that he has been getting occaisonal leg cramps but that this is not a new occurence.     Pertinent History  B THA    Patient Stated Goals  decr pain, be able to lift saddle with no  pain,     Currently in Pain?  No/denies       TREATMENT Manual Therapy Grade IV AP glenohumeral mobilizations with patient in supine and L shoulder abducted to 90 deg in neutral ER 30s x5 Grave IV inferior glenohumeral mobilizations with patient in supine and L shoulder abducted to 90 deg 30s x5 STM to L subscapularis with patient in standing in order to decrease pain and muscle spams x3 min  Therapeutic Exercises Resisted ER with YTB x10  Resisted IR with YTB 2x10 "w" exercises with YTB 2x10 (verbal cueing for proper form) Resisted L shoulder flexion with YTB 2x10 Resisted L shoulder abduction with YTB 2x10 Lat pull downs with YTB 2x10  Pulleys - flexion x20, abduction x20  Scapular retractions 2x10    Pt reports no increase in pain following manual therapy and therapeutic exercises.      PT Long Term Goals - 01/30/18 0947      PT LONG TERM GOAL #1   Title  Pt will be independent and compliant with HEP in order to maintain gains made in physical therapy and return to prior level of function.    Baseline  01/29/18: dependent with form and technique    Time  4    Period  Weeks    Status  New    Target Date  02/27/18      PT LONG TERM GOAL #2   Title  Pt will report worse  pain in past week less than 6/10 in order to return to prior level of function.    Baseline  01/29/18: 8/10    Time  4    Period  Weeks    Status  New    Target Date  02/27/18      PT LONG TERM GOAL #3   Title  Pt will report worse pain in past week less than 4/10 in order to return to prior level of function.    Baseline  01/29/18: 8/10    Time  6    Period  Weeks    Status  New    Target Date  03/13/18      PT LONG TERM GOAL #4   Title  Pt will report no pain while lifting saddle up onto horse in order to return to prior level of function and improve quality of life.    Baseline  01/29/18: pt reports pain while lifting saddle onto horse    Time  6    Period  Weeks    Status  New    Target Date   03/13/18      PT LONG TERM GOAL #5   Title  Pt will improve FOTO score from 59 to 74 in order to demonstrate functional improvements and improve quality of life.     Baseline  01/29/18: 59    Time  6    Period  Weeks    Status  New    Target Date  03/13/18            Plan - 02/06/18 0801    Clinical Impression Statement  Pt presents with improved ability to complete scapular retractions with decreased verbal and tactile cueing. Compared to previous session, pt demonstrates similar AROM and pain patterns. Pt presents with significant tenderness to palpation along L subscapularis muscle and reports that this is the same pain that has been bothering him. Pt demonstrates increased L shoulder abduction and elbow extension when performing "w" exercise and requires verbal cueing to correct.  Pt will benefit from continued skilled PT in order to return to prior level of function.    Rehab Potential  Good    Clinical Impairments Affecting Rehab Potential  (+) motivation, age     PT Frequency  2x / week    PT Duration  6 weeks    PT Treatment/Interventions  ADLs/Self Care Home Management;Aquatic Therapy;Biofeedback;Cryotherapy;Air traffic controller;Therapeutic exercise;Therapeutic activities;Functional mobility training;Patient/family education;Manual techniques;Passive range of motion;Dry needling    PT Next Visit Plan  STM to L subscap, review HEP, increase resistance/weight for TB exercises    PT Home Exercise Plan  resisted ER with YTB, scapular retractions, YTB shoulder flexion, IR, abduction and extension    Consulted and Agree with Plan of Care  Patient       Patient will benefit from skilled therapeutic intervention in order to improve the following deficits and impairments:  Hypomobility, Decreased mobility, Increased muscle spasms, Decreased range of motion, Improper body mechanics, Postural dysfunction, Impaired UE functional use, Pain  Visit  Diagnosis: Chronic left shoulder pain  Stiffness of left shoulder, not elsewhere classified  Other muscle spasm     Problem List Patient Active Problem List   Diagnosis Date Noted  . Inguinal hernia 12/03/2014  . Arthritis 11/05/2012  . Erectile dysfunction 11/05/2012  . Unspecified essential hypertension 11/05/2012   11:20 AM, 02/06/18 Etta Grandchild, PT, DPT Physical Therapist - [place] [phone] (Office)    Santiago Glad  Sherlene Shams ,SPT 02/06/2018, 8:14 AM  [place] [facility] [address], [place], [address] Phone: [phone]   Fax:  [phone]  Name: JAMESEN STAHNKE MRN: [id number] Date of Birth: October 03, 1955

## 2018-02-07 ENCOUNTER — Ambulatory Visit: Payer: 59

## 2018-02-07 DIAGNOSIS — M25612 Stiffness of left shoulder, not elsewhere classified: Secondary | ICD-10-CM

## 2018-02-07 DIAGNOSIS — M25512 Pain in left shoulder: Secondary | ICD-10-CM | POA: Diagnosis not present

## 2018-02-07 DIAGNOSIS — G8929 Other chronic pain: Secondary | ICD-10-CM

## 2018-02-07 DIAGNOSIS — M62838 Other muscle spasm: Secondary | ICD-10-CM | POA: Diagnosis not present

## 2018-02-07 NOTE — Therapy (Signed)
This entire session was performed under the direct supervision of a liscensed physical therapist.  12:40 PM, 02/08/18 Etta Grandchild, PT, DPT Physical Therapist - [place] [phone] (Office)      [facility] [address], [place], [address] Phone: [phone]   Fax:  [phone]  Physical Therapy Treatment  Patient Details  Name: [name] MRN: [id number] Date of Birth: May 28, 1956 Referring Provider: Dion Body, MD     Encounter Date: 02/07/2018  PT End of Session - 02/07/18 1826    Visit Number  3    Number of Visits  13    Date for PT Re-Evaluation  03/13/18    PT Start Time  1700    PT Stop Time  1745    PT Time Calculation (min)  45 min    Activity Tolerance  Patient tolerated treatment well    Behavior During Therapy  [facility] for tasks assessed/performed       Past Medical History:  Diagnosis Date  . Arthritis   . Complication of anesthesia    irreg. heartbeat during surgery for hip replacement 2010 (Dr. Einar Gip consulted for abnormal EKG with RBBB but found to be an old finding)  . Heart murmur   . Hypertension    reviewed by Dr. Einar Gip, cleared for surg. on 01/20/2015, ECHO results present   . PFO with atrial septal aneurysm    large PFO v/s small secundum ASD (saw Dr. Einar Gip 12/2014)    Past Surgical History:  Procedure Laterality Date  . INGUINAL HERNIA REPAIR Bilateral 02/09/2015   Procedure: LAPAROSCOPIC BILATERAL INGUINAL HERNIA REPAIRS;  Surgeon: Donnie Mesa, MD;  Location: [place];  Service: General;  Laterality: Bilateral;  . INSERTION OF MESH Bilateral 02/09/2015   Procedure: INSERTION OF MESH;  Surgeon: Donnie Mesa, MD;  Location: [place];  Service: General;  Laterality: Bilateral;  . JOINT REPLACEMENT Bilateral 2008, 2010    There were no vitals filed for this visit.  Subjective Assessment - 02/07/18 1818    Subjective  Pt reports that his shoulder has felt  "sore" after previous session and completing HEP last evening but that it is not painful. No other significant updates since previous session.     Pertinent History  B THA    Patient Stated Goals  decr pain, be able to lift saddle with no pain,     Currently in Pain?  No/denies       TREATMENT Manual Therapy STM to L subscapularis and teres minor with patient sidelying on R to decrease pain and muscle spasm x10 min  Therapeutic Exercises Resisted ER with RTB x10  Resisted IR with RTB x10 Resisted L shoulder flexion with RTB 2x10 Resisted L shoulder abduction with RTB 2x10  Scapular retractions 2x10 Body blade at 90deg shoulder flexion 20s x5  Tricep curls in supine with shoulder at 90deg flexion 5# DB x10 Sidelying shoulder abduction w/ 3# DB thumb facing ceiling x10, palm facing down x10  Patient reports decreased pain following manual therapy and therapeutic exercises.    PT Education - 02/07/18 1826    Education Details  Patient educated on proper form and technique for therapeutic exercises.     Person(s) Educated  Patient    Methods  Explanation;Demonstration    Comprehension  Verbalized understanding;Returned demonstration          PT Long Term Goals - 01/30/18 0947      PT LONG TERM GOAL #1  Title  Pt will be independent and compliant with HEP in order to maintain gains made in physical therapy and return to prior level of function.    Baseline  01/29/18: dependent with form and technique    Time  4    Period  Weeks    Status  New    Target Date  02/27/18      PT LONG TERM GOAL #2   Title  Pt will report worse pain in past week less than 6/10 in order to return to prior level of function.    Baseline  01/29/18: 8/10    Time  4    Period  Weeks    Status  New    Target Date  02/27/18      PT LONG TERM GOAL #3   Title  Pt will report worse pain in past week less than 4/10 in order to return to prior level of function.    Baseline  01/29/18: 8/10    Time  6     Period  Weeks    Status  New    Target Date  03/13/18      PT LONG TERM GOAL #4   Title  Pt will report no pain while lifting saddle up onto horse in order to return to prior level of function and improve quality of life.    Baseline  01/29/18: pt reports pain while lifting saddle onto horse    Time  6    Period  Weeks    Status  New    Target Date  03/13/18      PT LONG TERM GOAL #5   Title  Pt will improve FOTO score from 59 to 74 in order to demonstrate functional improvements and improve quality of life.     Baseline  01/29/18: 59    Time  6    Period  Weeks    Status  New    Target Date  03/13/18            Plan - 02/07/18 1831    Clinical Impression Statement  Following STM to subscapularis, pt was able to achieve full shoulder flexion with no pain. Pt continues to present with increased pain at 90 deg shoulder abduction through full ROM. With body blade exercise, pt demonstrates increased shoulder fatigue at approximately 20 seconds. Pt also demonstrates significant muscle tightness and tenderness along L subscpaularis and teres minor. Pt will continue to benefit from skilled PT in order to return to prior level of function.    Rehab Potential  Good    Clinical Impairments Affecting Rehab Potential  (+) motivation, age     PT Frequency  2x / week    PT Duration  6 weeks    PT Treatment/Interventions  ADLs/Self Care Home Management;Aquatic Therapy;Biofeedback;Cryotherapy;Air traffic controller;Therapeutic exercise;Therapeutic activities;Functional mobility training;Patient/family education;Manual techniques;Passive range of motion;Dry needling    PT Next Visit Plan  STM to L subscap, sidelying shoulder abduciton with weight     PT Home Exercise Plan  resisted ER with YTB, scapular retractions, YTB shoulder flexion, IR, abduction and extension    Consulted and Agree with Plan of Care  Patient       Patient will benefit from skilled  therapeutic intervention in order to improve the following deficits and impairments:  Hypomobility, Decreased mobility, Increased muscle spasms, Decreased range of motion, Improper body mechanics, Postural dysfunction, Impaired UE functional use, Pain  Visit Diagnosis: Stiffness of left shoulder, not  elsewhere classified  Chronic left shoulder pain  Other muscle spasm     Problem List Patient Active Problem List   Diagnosis Date Noted  . Inguinal hernia 12/03/2014  . Arthritis 11/05/2012  . Erectile dysfunction 11/05/2012  . Unspecified essential hypertension 11/05/2012    Georg Ruddle, SPT 02/07/2018, 6:41 PM  [facility] [address], [place], [address] Phone: [phone]   Fax:  [phone]  Name: SUFYAAN PALMA MRN: [id number] Date of Birth: 03-18-1956

## 2018-02-12 ENCOUNTER — Ambulatory Visit: Payer: 59

## 2018-02-12 DIAGNOSIS — M25612 Stiffness of left shoulder, not elsewhere classified: Secondary | ICD-10-CM | POA: Diagnosis not present

## 2018-02-12 DIAGNOSIS — G8929 Other chronic pain: Secondary | ICD-10-CM | POA: Diagnosis not present

## 2018-02-12 DIAGNOSIS — M62838 Other muscle spasm: Secondary | ICD-10-CM | POA: Diagnosis not present

## 2018-02-12 DIAGNOSIS — M25512 Pain in left shoulder: Secondary | ICD-10-CM

## 2018-02-13 NOTE — Therapy (Signed)
[facility] [address] [place], [address] Phone: [phone]   Fax:  [phone]  Physical Therapy Treatment  Patient Details  Name: [name] MRN: [id number] Date of Birth: 02-04-1956 Referring Provider: Dion Body, MD     Encounter Date: 02/12/2018  PT End of Session - 02/13/18 0729    Visit Number  3    Number of Visits  13    Date for PT Re-Evaluation  03/13/18    PT Start Time  1700    PT Stop Time  1745    PT Time Calculation (min)  45 min    Activity Tolerance  Patient tolerated treatment well    Behavior During Therapy  [facility] for tasks assessed/performed       Past Medical History:  Diagnosis Date  . Arthritis   . Complication of anesthesia    irreg. heartbeat during surgery for hip replacement 2010 (Dr. Einar Gip consulted for abnormal EKG with RBBB but found to be an old finding)  . Heart murmur   . Hypertension    reviewed by Dr. Einar Gip, cleared for surg. on 01/20/2015, ECHO results present   . PFO with atrial septal aneurysm    large PFO v/s small secundum ASD (saw Dr. Einar Gip 12/2014)    Past Surgical History:  Procedure Laterality Date  . INGUINAL HERNIA REPAIR Bilateral 02/09/2015   Procedure: LAPAROSCOPIC BILATERAL INGUINAL HERNIA REPAIRS;  Surgeon: Donnie Mesa, MD;  Location: [place];  Service: General;  Laterality: Bilateral;  . INSERTION OF MESH Bilateral 02/09/2015   Procedure: INSERTION OF MESH;  Surgeon: Donnie Mesa, MD;  Location: [place];  Service: General;  Laterality: Bilateral;  . JOINT REPLACEMENT Bilateral 2008, 2010    There were no vitals filed for this visit.  Subjective Assessment - 02/13/18 0727    Subjective  Pt reports that shoulder is sore today potentially due to lifting and carrying a lot of hay yesterday. Pt has been completing his HEP daily. No other signfiicant updates since previous treatment session.    Pertinent History  B THA    Patient Stated  Goals  decr pain, be able to lift saddle with no pain,     Currently in Pain?  No/denies       TREATMENT Manual Therapy STM to L subscapularis and teres minor with patient sidelying on R to decrease pain and muscle spasm x7 min Scapular mobilizations (superior, inferior, upward rotation, downward rotation) to decrease pain and muscle spasms in scapular musculature 30sx4 each direction   Therapeutic Exercises Scapular retractions 2x10 Resisted scapular retractions with YTB 2x10 Body blade at 90deg shoulder scaption (palm down x2, thumb up x3) 20s x5  Resisted ER with YTB 3x10  Rebounder ball toss flexion x20, 90deg abd+ER x20 Corner pec stretch 30s x4 Wall ball circles with 2# counterclockwise 2x10, clockwise 2x10  Resisted horizontal abd + ER with YTB x10     Patient reports decreased pain following manual therapy and therapeutic exercises.        PT Education - 02/13/18 0093    Education Details  Patient educated on proper form and technique for therapeutic exercises.    Person(s) Educated  Patient    Methods  Explanation;Demonstration    Comprehension  Verbalized understanding;Returned demonstration          PT Long Term Goals - 01/30/18 0947      PT LONG TERM GOAL #1   Title  Pt will be independent  and compliant with HEP in order to maintain gains made in physical therapy and return to prior level of function.    Baseline  01/29/18: dependent with form and technique    Time  4    Period  Weeks    Status  New    Target Date  02/27/18      PT LONG TERM GOAL #2   Title  Pt will report worse pain in past week less than 6/10 in order to return to prior level of function.    Baseline  01/29/18: 8/10    Time  4    Period  Weeks    Status  New    Target Date  02/27/18      PT LONG TERM GOAL #3   Title  Pt will report worse pain in past week less than 4/10 in order to return to prior level of function.    Baseline  01/29/18: 8/10    Time  6    Period  Weeks    Status   New    Target Date  03/13/18      PT LONG TERM GOAL #4   Title  Pt will report no pain while lifting saddle up onto horse in order to return to prior level of function and improve quality of life.    Baseline  01/29/18: pt reports pain while lifting saddle onto horse    Time  6    Period  Weeks    Status  New    Target Date  03/13/18      PT LONG TERM GOAL #5   Title  Pt will improve FOTO score from 59 to 74 in order to demonstrate functional improvements and improve quality of life.     Baseline  01/29/18: 59    Time  6    Period  Weeks    Status  New    Target Date  03/13/18            Plan - 02/13/18 0729    Clinical Impression Statement  Pt presents with decreased tenderness to palpation along L subscapularis muscle and teres minor and reports decreased pain following STM to previously mentioned musculature. Pt requires verbal and tactile cueing for resisted scapular retractions with YTB. Pt also requires cueing for proper technique with "w" exercises due to tendency to extend elbows instead of performing external rotation. Pt will continue to benefit from skilled PT in order to return to prior level of function.     Rehab Potential  Good    Clinical Impairments Affecting Rehab Potential  (+) motivation, age     PT Frequency  2x / week    PT Duration  6 weeks    PT Treatment/Interventions  ADLs/Self Care Home Management;Aquatic Therapy;Biofeedback;Cryotherapy;Air traffic controller;Therapeutic exercise;Therapeutic activities;Functional mobility training;Patient/family education;Manual techniques;Passive range of motion;Dry needling    PT Next Visit Plan  STM to L subscap, sidelying shoulder abduciton with weight     PT Home Exercise Plan  resisted ER with YTB, scapular retractions, YTB shoulder flexion, IR, abduction and extension    Consulted and Agree with Plan of Care  Patient       Patient will benefit from skilled therapeutic  intervention in order to improve the following deficits and impairments:  Hypomobility, Decreased mobility, Increased muscle spasms, Decreased range of motion, Improper body mechanics, Postural dysfunction, Impaired UE functional use, Pain  Visit Diagnosis: Stiffness of left shoulder, not elsewhere classified  Chronic left  shoulder pain  Other muscle spasm     Problem List Patient Active Problem List   Diagnosis Date Noted  . Inguinal hernia 12/03/2014  . Arthritis 11/05/2012  . Erectile dysfunction 11/05/2012  . Unspecified essential hypertension 11/05/2012    Georg Ruddle, SPT 02/13/2018, 9:50 AM  [facility] [address] [place], [address] Phone: [phone]   Fax:  [phone]  Name: [name] MRN: [id number] Date of Birth: 1956-04-18

## 2018-02-14 ENCOUNTER — Ambulatory Visit: Payer: 59

## 2018-02-14 DIAGNOSIS — G8929 Other chronic pain: Secondary | ICD-10-CM | POA: Diagnosis not present

## 2018-02-14 DIAGNOSIS — M25512 Pain in left shoulder: Secondary | ICD-10-CM

## 2018-02-14 DIAGNOSIS — M62838 Other muscle spasm: Secondary | ICD-10-CM | POA: Diagnosis not present

## 2018-02-14 DIAGNOSIS — M25612 Stiffness of left shoulder, not elsewhere classified: Secondary | ICD-10-CM

## 2018-02-14 NOTE — Therapy (Signed)
[facility] [address] [place], [address] Phone: [phone]   Fax:  [phone]  Physical Therapy Treatment  Patient Details  Name: [name] MRN: [id number] Date of Birth: 02-08-1956 Referring Provider: Dion Body, MD     Encounter Date: 02/14/2018  PT End of Session - 02/14/18 1756    Visit Number  4    Number of Visits  13    Date for PT Re-Evaluation  03/13/18    PT Start Time  1700    PT Stop Time  1745    PT Time Calculation (min)  45 min    Activity Tolerance  Patient tolerated treatment well    Behavior During Therapy  [facility] for tasks assessed/performed       Past Medical History:  Diagnosis Date  . Arthritis   . Complication of anesthesia    irreg. heartbeat during surgery for hip replacement 2010 (Dr. Einar Gip consulted for abnormal EKG with RBBB but found to be an old finding)  . Heart murmur   . Hypertension    reviewed by Dr. Einar Gip, cleared for surg. on 01/20/2015, ECHO results present   . PFO with atrial septal aneurysm    large PFO v/s small secundum ASD (saw Dr. Einar Gip 12/2014)    Past Surgical History:  Procedure Laterality Date  . INGUINAL HERNIA REPAIR Bilateral 02/09/2015   Procedure: LAPAROSCOPIC BILATERAL INGUINAL HERNIA REPAIRS;  Surgeon: Donnie Mesa, MD;  Location: [place];  Service: General;  Laterality: Bilateral;  . INSERTION OF MESH Bilateral 02/09/2015   Procedure: INSERTION OF MESH;  Surgeon: Donnie Mesa, MD;  Location: [place];  Service: General;  Laterality: Bilateral;  . JOINT REPLACEMENT Bilateral 2008, 2010    There were no vitals filed for this visit.  Subjective Assessment - 02/14/18 1755    Subjective  Pt reports that shoulder is sore from session on tuesday and that he did not complete HEP yesterday. No other significant updates since previous session.    Pertinent History  B THA    Patient Stated Goals  decr pain, be able to lift saddle with no  pain,     Currently in Pain?  No/denies         TREATMENT Manual Therapy STM to L subscapularis and teres minor with patient sidelying on R to decrease pain and muscle spasm x7 min Scapular mobilizations (superior, inferior, upward rotation, downward rotation) to decrease pain and muscle spasms in scapular musculature 30sx4 each direction AP and inferior grade IV glenohumeral mobilizations 30sx5 to decrease pain and increase range of motion  Therapeutic Exercises Scapular retractions 2x10 Resisted scapular retractions with YTB 2x10  Resisted IR with YTB 3x10  Sidelying hip abduction with 1# weight 2x15 Sidelying ER with 2# x15, 3# weight x15 Corner pec stretch 30s x4   Patient reports decreased pain following manual therapy and therapeutic exercises.    PT Education - 02/14/18 1756    Education Details  Patient educated on proper form and technique for therapeutic exercises.    Person(s) Educated  Patient    Methods  Explanation;Demonstration    Comprehension  Verbalized understanding;Returned demonstration          PT Long Term Goals - 01/30/18 0947      PT LONG TERM GOAL #1   Title  Pt will be independent and compliant with HEP in order to maintain gains made in physical therapy and return to prior level of function.  Baseline  01/29/18: dependent with form and technique    Time  4    Period  Weeks    Status  New    Target Date  02/27/18      PT LONG TERM GOAL #2   Title  Pt will report worse pain in past week less than 6/10 in order to return to prior level of function.    Baseline  01/29/18: 8/10    Time  4    Period  Weeks    Status  New    Target Date  02/27/18      PT LONG TERM GOAL #3   Title  Pt will report worse pain in past week less than 4/10 in order to return to prior level of function.    Baseline  01/29/18: 8/10    Time  6    Period  Weeks    Status  New    Target Date  03/13/18      PT LONG TERM GOAL #4   Title  Pt will report no pain  while lifting saddle up onto horse in order to return to prior level of function and improve quality of life.    Baseline  01/29/18: pt reports pain while lifting saddle onto horse    Time  6    Period  Weeks    Status  New    Target Date  03/13/18      PT LONG TERM GOAL #5   Title  Pt will improve FOTO score from 59 to 74 in order to demonstrate functional improvements and improve quality of life.     Baseline  01/29/18: 59    Time  6    Period  Weeks    Status  New    Target Date  03/13/18            Plan - 02/14/18 1757    Clinical Impression Statement  Pt continues to demonstrate tenderness along subscapularis, anterior deltoid, and teres minor. Pt requires verbal and tactile cueing for proper technique for "w" exercise and scapular retractions. Pt continues to present with increased pain with horizontal abduction. Pt wll continue to benefit from skilled PT in order to return to prior level of function.    Rehab Potential  Good    Clinical Impairments Affecting Rehab Potential  (+) motivation, age     PT Frequency  2x / week    PT Duration  6 weeks    PT Treatment/Interventions  ADLs/Self Care Home Management;Aquatic Therapy;Biofeedback;Cryotherapy;Air traffic controller;Therapeutic exercise;Therapeutic activities;Functional mobility training;Patient/family education;Manual techniques;Passive range of motion;Dry needling    PT Next Visit Plan  STM to L subscap, sidelying shoulder abduciton with weight     PT Home Exercise Plan  resisted ER with YTB, scapular retractions, YTB shoulder flexion, IR, abduction and extension    Consulted and Agree with Plan of Care  Patient       Patient will benefit from skilled therapeutic intervention in order to improve the following deficits and impairments:  Hypomobility, Decreased mobility, Increased muscle spasms, Decreased range of motion, Improper body mechanics, Postural dysfunction, Impaired UE functional  use, Pain  Visit Diagnosis: Stiffness of left shoulder, not elsewhere classified  Chronic left shoulder pain  Other muscle spasm     Problem List Patient Active Problem List   Diagnosis Date Noted  . Inguinal hernia 12/03/2014  . Arthritis 11/05/2012  . Erectile dysfunction 11/05/2012  . Unspecified essential hypertension 11/05/2012  Georg Ruddle, SPT 02/14/2018, 5:59 PM  [facility] [address] [place], [address] Phone: [phone]   Fax:  [phone]  Name: [name] MRN: [id number] Date of Birth: Apr 28, 1956

## 2018-02-19 ENCOUNTER — Ambulatory Visit: Payer: 59

## 2018-02-19 DIAGNOSIS — M62838 Other muscle spasm: Secondary | ICD-10-CM

## 2018-02-19 DIAGNOSIS — M25612 Stiffness of left shoulder, not elsewhere classified: Secondary | ICD-10-CM

## 2018-02-19 DIAGNOSIS — G8929 Other chronic pain: Secondary | ICD-10-CM | POA: Diagnosis not present

## 2018-02-19 DIAGNOSIS — M25512 Pain in left shoulder: Secondary | ICD-10-CM

## 2018-02-19 NOTE — Therapy (Signed)
[facility] [address], [place], [address] Phone: [phone]   Fax:  [phone]  Physical Therapy Treatment  Patient Details  Name: [name] MRN: [id number] Date of Birth: 1955-08-19 Referring Provider: Dion Body, MD     Encounter Date: 02/19/2018  PT End of Session - 02/19/18 1755    Visit Number  5    Number of Visits  13    Date for PT Re-Evaluation  03/13/18    PT Start Time  1749    PT Stop Time  1830    PT Time Calculation (min)  43 min    Activity Tolerance  Patient tolerated treatment well    Behavior During Therapy  [facility] for tasks assessed/performed       Past Medical History:  Diagnosis Date  . Arthritis   . Complication of anesthesia    irreg. heartbeat during surgery for hip replacement 2010 (Dr. Einar Gip consulted for abnormal EKG with RBBB but found to be an old finding)  . Heart murmur   . Hypertension    reviewed by Dr. Einar Gip, cleared for surg. on 01/20/2015, ECHO results present   . PFO with atrial septal aneurysm    large PFO v/s small secundum ASD (saw Dr. Einar Gip 12/2014)    Past Surgical History:  Procedure Laterality Date  . INGUINAL HERNIA REPAIR Bilateral 02/09/2015   Procedure: LAPAROSCOPIC BILATERAL INGUINAL HERNIA REPAIRS;  Surgeon: Donnie Mesa, MD;  Location: [place];  Service: General;  Laterality: Bilateral;  . INSERTION OF MESH Bilateral 02/09/2015   Procedure: INSERTION OF MESH;  Surgeon: Donnie Mesa, MD;  Location: [place];  Service: General;  Laterality: Bilateral;  . JOINT REPLACEMENT Bilateral 2008, 2010    There were no vitals filed for this visit.  Subjective Assessment - 02/19/18 1705    Subjective  Patient reports increased pain with performing abduction in standing and sitting. Otherwise no baseline pain.     Pertinent History  B THA    Patient Stated Goals  decr pain, be able to lift saddle with no pain,     Currently in Pain?  No/denies          TREATMENT Manual Therapy STM to L subscapularis and teres minor with patient sidelying on R to decrease pain and muscle spasm x7 min Scapular mobilizations (superior, inferior, upward rotation, downward rotation) to decrease pain and muscle spasms in scapular musculature 30sx4 each direction   Therapeutic Exercises  Scapular retractions at [place]  2x20 10# Straight arm push downs at [place] - 2 x20 (first set 10#; second set 15#) High row in sitting at [place] - 2 x 15 10# B shoulder ER with YTB - x 20  Shoulder flexion with YTB around wrists for ER - x 20  Corner pec stretch 30s x4 Push up PLUS - x 30  Straight arm serratus punches - x 20 with YTB    Patient reports decreased pain following manual therapy and therapeutic exercises.     PT Education - 02/19/18 1754    Education Details  form/technique with exercise     Person(s) Educated  Patient    Methods  Explanation;Demonstration    Comprehension  Verbalized understanding;Returned demonstration          PT Long Term Goals - 01/30/18 0947      PT LONG TERM GOAL #1   Title  Pt will be independent and compliant with HEP in order to maintain gains made  in physical therapy and return to prior level of function.    Baseline  01/29/18: dependent with form and technique    Time  4    Period  Weeks    Status  New    Target Date  02/27/18      PT LONG TERM GOAL #2   Title  Pt will report worse pain in past week less than 6/10 in order to return to prior level of function.    Baseline  01/29/18: 8/10    Time  4    Period  Weeks    Status  New    Target Date  02/27/18      PT LONG TERM GOAL #3   Title  Pt will report worse pain in past week less than 4/10 in order to return to prior level of function.    Baseline  01/29/18: 8/10    Time  6    Period  Weeks    Status  New    Target Date  03/13/18      PT LONG TERM GOAL #4   Title  Pt will report no pain while lifting saddle up onto horse in order to return to prior  level of function and improve quality of life.    Baseline  01/29/18: pt reports pain while lifting saddle onto horse    Time  6    Period  Weeks    Status  New    Target Date  03/13/18      PT LONG TERM GOAL #5   Title  Pt will improve FOTO score from 59 to 74 in order to demonstrate functional improvements and improve quality of life.     Baseline  01/29/18: 59    Time  6    Period  Weeks    Status  New    Target Date  03/13/18            Plan - 02/19/18 1803    Clinical Impression Statement  Patient demosntrates improvement in shoulder abudction AROM with decreased pain after performing manual therapy and exercises stressing scapular retraction indicating poor motor control of the scapular stabilization musculature. Patient will benefit from further skilled therapy to return to prior level of function.     Rehab Potential  Good    Clinical Impairments Affecting Rehab Potential  (+) motivation, age     PT Frequency  2x / week    PT Duration  6 weeks    PT Treatment/Interventions  ADLs/Self Care Home Management;Aquatic Therapy;Biofeedback;Cryotherapy;Air traffic controller;Therapeutic exercise;Therapeutic activities;Functional mobility training;Patient/family education;Manual techniques;Passive range of motion;Dry needling    PT Next Visit Plan  STM to L subscap, sidelying shoulder abduciton with weight     PT Home Exercise Plan  resisted ER with YTB, scapular retractions, YTB shoulder flexion, IR, abduction and extension    Consulted and Agree with Plan of Care  Patient       Patient will benefit from skilled therapeutic intervention in order to improve the following deficits and impairments:  Hypomobility, Decreased mobility, Increased muscle spasms, Decreased range of motion, Improper body mechanics, Postural dysfunction, Impaired UE functional use, Pain  Visit Diagnosis: Stiffness of left shoulder, not elsewhere classified  Other muscle  spasm  Chronic left shoulder pain     Problem List Patient Active Problem List   Diagnosis Date Noted  . Inguinal hernia 12/03/2014  . Arthritis 11/05/2012  . Erectile dysfunction 11/05/2012  . Unspecified essential hypertension 11/05/2012  Blythe Stanford, PT DPT 02/19/2018, 6:12 PM  [facility] [address], [place], [address] Phone: [phone]   Fax:  [phone]  Name: [name] MRN: [id number] Date of Birth: 30-Apr-1956

## 2018-02-21 ENCOUNTER — Ambulatory Visit: Payer: 59

## 2018-02-27 ENCOUNTER — Ambulatory Visit: Payer: 59

## 2018-03-06 ENCOUNTER — Ambulatory Visit: Payer: 59 | Attending: Family Medicine

## 2018-03-06 DIAGNOSIS — G8929 Other chronic pain: Secondary | ICD-10-CM | POA: Insufficient documentation

## 2018-03-06 DIAGNOSIS — M62838 Other muscle spasm: Secondary | ICD-10-CM | POA: Insufficient documentation

## 2018-03-06 DIAGNOSIS — M25512 Pain in left shoulder: Secondary | ICD-10-CM | POA: Insufficient documentation

## 2018-03-06 DIAGNOSIS — M25612 Stiffness of left shoulder, not elsewhere classified: Secondary | ICD-10-CM | POA: Insufficient documentation

## 2018-03-13 ENCOUNTER — Ambulatory Visit: Payer: 59

## 2018-03-13 DIAGNOSIS — G8929 Other chronic pain: Secondary | ICD-10-CM | POA: Diagnosis not present

## 2018-03-13 DIAGNOSIS — M25612 Stiffness of left shoulder, not elsewhere classified: Secondary | ICD-10-CM

## 2018-03-13 DIAGNOSIS — M62838 Other muscle spasm: Secondary | ICD-10-CM | POA: Diagnosis not present

## 2018-03-13 DIAGNOSIS — M25512 Pain in left shoulder: Secondary | ICD-10-CM

## 2018-03-14 DIAGNOSIS — N289 Disorder of kidney and ureter, unspecified: Secondary | ICD-10-CM | POA: Diagnosis not present

## 2018-03-14 NOTE — Therapy (Signed)
[facility] [address] [place], [address] Phone: [phone]   Fax:  [phone]  Physical Therapy Treatment  Patient Details  Name: [name] MRN: [id number] Date of Birth: May 22, 1956 Referring Provider: Dion Body, MD     Encounter Date: 03/13/2018  PT End of Session - 03/14/18 1247    Visit Number  6    Number of Visits  13    Date for PT Re-Evaluation  03/13/18    PT Start Time  1962    PT Stop Time  1800    PT Time Calculation (min)  45 min    Activity Tolerance  Patient tolerated treatment well    Behavior During Therapy  [facility] for tasks assessed/performed       Past Medical History:  Diagnosis Date  . Arthritis   . Complication of anesthesia    irreg. heartbeat during surgery for hip replacement 2010 (Dr. Einar Gip consulted for abnormal EKG with RBBB but found to be an old finding)  . Heart murmur   . Hypertension    reviewed by Dr. Einar Gip, cleared for surg. on 01/20/2015, ECHO results present   . PFO with atrial septal aneurysm    large PFO v/s small secundum ASD (saw Dr. Einar Gip 12/2014)    Past Surgical History:  Procedure Laterality Date  . INGUINAL HERNIA REPAIR Bilateral 02/09/2015   Procedure: LAPAROSCOPIC BILATERAL INGUINAL HERNIA REPAIRS;  Surgeon: Donnie Mesa, MD;  Location: [place];  Service: General;  Laterality: Bilateral;  . INSERTION OF MESH Bilateral 02/09/2015   Procedure: INSERTION OF MESH;  Surgeon: Donnie Mesa, MD;  Location: [place];  Service: General;  Laterality: Bilateral;  . JOINT REPLACEMENT Bilateral 2008, 2010    There were no vitals filed for this visit.  Subjective Assessment - 03/14/18 1156    Subjective  Patient reports he continues to have increase in pain but decreased intensity in pain and is able to move his shoulder through greater AROM.     Pertinent History  B THA    Patient Stated Goals  decr pain, be able to lift saddle with no pain,     Currently in Pain?  No/denies       TREATMENT Manual Therapy STM to L subscapularis, lats, and teres minor with patient sidelying on R to decrease pain and muscle spasm x7 min Scapular mobilizations (superior, inferior, upward rotation, downward rotation) to decrease pain and muscle spasms in scapular musculature 30sx4 each direction   Therapeutic Exercises Lat pull downs in standing - 2 x 20 45# 90-90 Shoulder ER with GTB - x20  Supine 90-90 shoulder ER  -- x 20  Behind the head Lat pull downs - x 20 45#   Patient reports decreased pain following manual therapy and therapeutic exercises.    PT Education - 03/14/18 1157    Education Details  form/technique with exercise    Person(s) Educated  Patient    Methods  Explanation;Demonstration    Comprehension  Verbalized understanding;Returned demonstration          PT Long Term Goals - 01/30/18 0947      PT LONG TERM GOAL #1   Title  Pt will be independent and compliant with HEP in order to maintain gains made in physical therapy and return to prior level of function.    Baseline  01/29/18: dependent with form and technique    Time  4    Period  Weeks  Status  New    Target Date  02/27/18      PT LONG TERM GOAL #2   Title  Pt will report worse pain in past week less than 6/10 in order to return to prior level of function.    Baseline  01/29/18: 8/10    Time  4    Period  Weeks    Status  New    Target Date  02/27/18      PT LONG TERM GOAL #3   Title  Pt will report worse pain in past week less than 4/10 in order to return to prior level of function.    Baseline  01/29/18: 8/10    Time  6    Period  Weeks    Status  New    Target Date  03/13/18      PT LONG TERM GOAL #4   Title  Pt will report no pain while lifting saddle up onto horse in order to return to prior level of function and improve quality of life.    Baseline  01/29/18: pt reports pain while lifting saddle onto horse    Time  6    Period  Weeks    Status   New    Target Date  03/13/18      PT LONG TERM GOAL #5   Title  Pt will improve FOTO score from 59 to 74 in order to demonstrate functional improvements and improve quality of life.     Baseline  01/29/18: 59    Time  6    Period  Weeks    Status  New    Target Date  03/13/18            Plan - 03/14/18 1247    Clinical Impression Statement  Patient demonstrates improvement with shoulder flexion/abduction compared to previous sessions. Patient continues to demonstrate poor strength at end range with shoulder flexion and shoulder ER. Patient will benefit from further skilled therapy to return to prior level of function.     Rehab Potential  Good    Clinical Impairments Affecting Rehab Potential  (+) motivation, age     PT Frequency  2x / week    PT Duration  6 weeks    PT Treatment/Interventions  ADLs/Self Care Home Management;Aquatic Therapy;Biofeedback;Cryotherapy;Air traffic controller;Therapeutic exercise;Therapeutic activities;Functional mobility training;Patient/family education;Manual techniques;Passive range of motion;Dry needling    PT Next Visit Plan  STM to L subscap, sidelying shoulder abduciton with weight     PT Home Exercise Plan  resisted ER with YTB, scapular retractions, YTB shoulder flexion, IR, abduction and extension    Consulted and Agree with Plan of Care  Patient       Patient will benefit from skilled therapeutic intervention in order to improve the following deficits and impairments:  Hypomobility, Decreased mobility, Increased muscle spasms, Decreased range of motion, Improper body mechanics, Postural dysfunction, Impaired UE functional use, Pain  Visit Diagnosis: Stiffness of left shoulder, not elsewhere classified  Other muscle spasm  Chronic left shoulder pain     Problem List Patient Active Problem List   Diagnosis Date Noted  . Inguinal hernia 12/03/2014  . Arthritis 11/05/2012  . Erectile dysfunction  11/05/2012  . Unspecified essential hypertension 11/05/2012    Blythe Stanford, PT DPT 03/14/2018, 12:54 PM  [facility] [address], [place], [address] Phone: [phone]   Fax:  [phone]  Name: [name]  MRN: [id number] Date of Birth: June 10, 1956

## 2018-03-18 ENCOUNTER — Ambulatory Visit: Payer: 59

## 2018-03-18 DIAGNOSIS — G8929 Other chronic pain: Secondary | ICD-10-CM | POA: Diagnosis not present

## 2018-03-18 DIAGNOSIS — M25512 Pain in left shoulder: Secondary | ICD-10-CM | POA: Diagnosis not present

## 2018-03-18 DIAGNOSIS — M62838 Other muscle spasm: Secondary | ICD-10-CM | POA: Diagnosis not present

## 2018-03-18 DIAGNOSIS — M25612 Stiffness of left shoulder, not elsewhere classified: Secondary | ICD-10-CM

## 2018-03-19 NOTE — Therapy (Signed)
[facility] [address], [place], [address] Phone: [phone]   Fax:  [phone]  Physical Therapy Treatment  Patient Details  Name: [name] MRN: [id number] Date of Birth: 04/24/1956 Referring Provider: Dion Body, MD     Encounter Date: 03/18/2018  PT End of Session - 03/18/18 1926    Visit Number  7    Number of Visits  13    Date for PT Re-Evaluation  03/13/18    PT Start Time  1845    PT Stop Time  1930    PT Time Calculation (min)  45 min    Activity Tolerance  Patient tolerated treatment well    Behavior During Therapy  [facility] for tasks assessed/performed       Past Medical History:  Diagnosis Date  . Arthritis   . Complication of anesthesia    irreg. heartbeat during surgery for hip replacement 2010 (Dr. Einar Gip consulted for abnormal EKG with RBBB but found to be an old finding)  . Heart murmur   . Hypertension    reviewed by Dr. Einar Gip, cleared for surg. on 01/20/2015, ECHO results present   . PFO with atrial septal aneurysm    large PFO v/s small secundum ASD (saw Dr. Einar Gip 12/2014)    Past Surgical History:  Procedure Laterality Date  . INGUINAL HERNIA REPAIR Bilateral 02/09/2015   Procedure: LAPAROSCOPIC BILATERAL INGUINAL HERNIA REPAIRS;  Surgeon: Donnie Mesa, MD;  Location: [place];  Service: General;  Laterality: Bilateral;  . INSERTION OF MESH Bilateral 02/09/2015   Procedure: INSERTION OF MESH;  Surgeon: Donnie Mesa, MD;  Location: [place];  Service: General;  Laterality: Bilateral;  . JOINT REPLACEMENT Bilateral 2008, 2010    There were no vitals filed for this visit.  Subjective Assessment - 03/18/18 1913    Subjective  Patient reports he felt his shoulder is improving but not compeltely healed but is improving considerably.     Pertinent History  B THA    Patient Stated Goals  decr pain, be able to lift saddle with no pain,     Currently in Pain?  No/denies         TREATMENT Manual Therapy STM to L subscapularis, lats, shoulder flexion, and teres minor with patient sidelying on R to decrease pain and muscle spasm x8 min   Therapeutic Exercises Lat pull downs in standing - x 20 45#, x 20 55#  Overhead ball taps - 2 x 45sec Ball circles in standing - 2 x 30sec cw/ccw 90-90 Shoulder ER with GTB - 2x20  Serratus punches in standing with RTB - 2 x 20  Shoulder flexion with YTB around wrists for shoulder ER - 2 x 20    Patient reports decreased pain following manual therapy and therapeutic exercises.     PT Education - 03/18/18 1919    Education Details  form/technique with exericise    Person(s) Educated  Patient    Methods  Explanation;Demonstration    Comprehension  Verbalized understanding;Returned demonstration          PT Long Term Goals - 03/18/18 1930      PT LONG TERM GOAL #1   Title  Pt will be independent and compliant with HEP in order to maintain gains made in physical therapy and return to prior level of function.    Baseline  01/29/18: dependent with form and technique; 03/18/2018: moderate cueing to perform exercises correctly    Time  4    Period  Weeks    Status  On-going      PT LONG TERM GOAL #2   Title  Pt will report worse pain in past week less than 6/10 in order to return to prior level of function.    Baseline  01/29/18: 8/10; 03/18/2018: 4/10    Time  4    Period  Weeks    Status  Achieved      PT LONG TERM GOAL #3   Title  Pt will report worse pain in past week less than 4/10 in order to return to prior level of function.    Baseline  01/29/18: 8/10; 03/18/2018: 4/10    Time  6    Period  Weeks    Status  On-going      PT LONG TERM GOAL #4   Title  Pt will report no pain while lifting saddle up onto horse in order to return to prior level of function and improve quality of life.    Baseline  01/29/18: pt reports pain while lifting saddle onto horse; 03/18/2018: Increased pain to perform    Time  6    Period   Weeks    Status  On-going      PT LONG TERM GOAL #5   Title  Pt will improve FOTO score from 59 to 74 in order to demonstrate functional improvements and improve quality of life.     Baseline  01/29/18: 59; 03/18/2018: 72    Time  6    Period  Weeks    Status  New            Plan - 03/19/18 1049    Clinical Impression Statement  Patient is making progress towards long term goals with improvement in worse pain and improvement in FOTO scores indicating functional improvement over his affected shoulder. Patient states decreased pain overall with greater amount of over shoulder AROM. Although patient is improving, he continues to demonstrates increased pain with lifting weighted objects such as a saddle indicating decreased strength and coordination. Patient will benefit from further skilled therapy to return to prior level of function.     Rehab Potential  Good    Clinical Impairments Affecting Rehab Potential  (+) motivation, age     PT Frequency  2x / week    PT Duration  6 weeks    PT Treatment/Interventions  ADLs/Self Care Home Management;Aquatic Therapy;Biofeedback;Cryotherapy;Air traffic controller;Therapeutic exercise;Therapeutic activities;Functional mobility training;Patient/family education;Manual techniques;Passive range of motion;Dry needling    PT Next Visit Plan  STM to L subscap, sidelying shoulder abduciton with weight     PT Home Exercise Plan  resisted ER with YTB, scapular retractions, YTB shoulder flexion, IR, abduction and extension    Consulted and Agree with Plan of Care  Patient       Patient will benefit from skilled therapeutic intervention in order to improve the following deficits and impairments:  Hypomobility, Decreased mobility, Increased muscle spasms, Decreased range of motion, Improper body mechanics, Postural dysfunction, Impaired UE functional use, Pain  Visit Diagnosis: Stiffness of left shoulder, not elsewhere  classified  Chronic left shoulder pain  Other muscle spasm     Problem List Patient Active Problem List   Diagnosis Date Noted  . Inguinal hernia 12/03/2014  . Arthritis 11/05/2012  . Erectile dysfunction 11/05/2012  . Unspecified essential hypertension 11/05/2012    Blythe Stanford, PT DPT 03/19/2018, 10:53 AM  [facility] [address] [place], [address] Phone: [phone]   Fax:  [phone]  Name: [name] MRN: [id number] Date of Birth: [birth year]

## 2018-03-20 ENCOUNTER — Ambulatory Visit: Payer: 59

## 2018-03-21 DIAGNOSIS — E669 Obesity, unspecified: Secondary | ICD-10-CM | POA: Diagnosis not present

## 2018-03-21 DIAGNOSIS — N183 Chronic kidney disease, stage 3 (moderate): Secondary | ICD-10-CM | POA: Diagnosis not present

## 2018-03-21 DIAGNOSIS — I10 Essential (primary) hypertension: Secondary | ICD-10-CM | POA: Diagnosis not present

## 2018-03-21 DIAGNOSIS — N528 Other male erectile dysfunction: Secondary | ICD-10-CM | POA: Diagnosis not present

## 2018-03-25 ENCOUNTER — Ambulatory Visit: Payer: 59

## 2018-03-27 ENCOUNTER — Ambulatory Visit: Payer: 59 | Attending: Family Medicine

## 2018-03-27 DIAGNOSIS — M25612 Stiffness of left shoulder, not elsewhere classified: Secondary | ICD-10-CM | POA: Insufficient documentation

## 2018-03-27 DIAGNOSIS — G8929 Other chronic pain: Secondary | ICD-10-CM | POA: Insufficient documentation

## 2018-03-27 DIAGNOSIS — M25512 Pain in left shoulder: Secondary | ICD-10-CM | POA: Insufficient documentation

## 2018-03-27 DIAGNOSIS — M62838 Other muscle spasm: Secondary | ICD-10-CM | POA: Insufficient documentation

## 2018-04-03 ENCOUNTER — Ambulatory Visit: Payer: 59

## 2018-04-03 DIAGNOSIS — M25512 Pain in left shoulder: Secondary | ICD-10-CM | POA: Diagnosis not present

## 2018-04-03 DIAGNOSIS — G8929 Other chronic pain: Secondary | ICD-10-CM | POA: Diagnosis not present

## 2018-04-03 DIAGNOSIS — M25612 Stiffness of left shoulder, not elsewhere classified: Secondary | ICD-10-CM

## 2018-04-03 DIAGNOSIS — M62838 Other muscle spasm: Secondary | ICD-10-CM | POA: Diagnosis not present

## 2018-04-03 NOTE — Therapy (Signed)
[facility] [address], [place], [address] Phone: [phone]   Fax:  [phone]  Physical Therapy Treatment  Patient Details  Name: [name] MRN: [id number] Date of Birth: August 30, 1955 Referring Provider (PT): Dion Body, MD     Encounter Date: 04/03/2018  PT End of Session - 04/03/18 1932    Visit Number  7    Number of Visits  13    Date for PT Re-Evaluation  03/13/18    Activity Tolerance  Patient tolerated treatment well    Behavior During Therapy  [facility] for tasks assessed/performed       Past Medical History:  Diagnosis Date  . Arthritis   . Complication of anesthesia    irreg. heartbeat during surgery for hip replacement 2010 (Dr. Einar Gip consulted for abnormal EKG with RBBB but found to be an old finding)  . Heart murmur   . Hypertension    reviewed by Dr. Einar Gip, cleared for surg. on 01/20/2015, ECHO results present   . PFO with atrial septal aneurysm    large PFO v/s small secundum ASD (saw Dr. Einar Gip 12/2014)    Past Surgical History:  Procedure Laterality Date  . INGUINAL HERNIA REPAIR Bilateral 02/09/2015   Procedure: LAPAROSCOPIC BILATERAL INGUINAL HERNIA REPAIRS;  Surgeon: Donnie Mesa, MD;  Location: [facility];  Service: General;  Laterality: Bilateral;  . INSERTION OF MESH Bilateral 02/09/2015   Procedure: INSERTION OF MESH;  Surgeon: Donnie Mesa, MD;  Location: [facility];  Service: General;  Laterality: Bilateral;  . JOINT REPLACEMENT Bilateral 2008, 2010    There were no vitals filed for this visit.  Subjective Assessment - 04/03/18 1907    Subjective  Patient reports he has had some conflicts at work limitations but states the shoulder overall has been improving considerably over the past 2 weeks.     Pertinent History  B THA    Patient Stated Goals  decr pain, be able to lift saddle with no pain,     Currently in Pain?  No/denies       TREATMENT Manual Therapy STM to L  subscapularis, lats, shoulder flexion, and teres minor with patient sidelying on R to decrease pain and muscle spasm x6 min Shoulder mobilization inferior, posterior - 3 x 30 sec in each direction grade: IV to improve shoulder    Therapeutic Exercises Tricep push downs at [place] in standing - x 20 20# At the wall Shoulder ER to end range - x 20  Shoulder flexion in standing at the wall - x 20  Shoulder rows in standing at [place] - x 20 25# Standing posterior deltoid shoulder Extension at [place] - x20 10# Shoulders at 90 degrees of abduction small arm circles in standing - x 30 cw/ccw with 2# weight Shoulder extension in standing with GTB - x 20    Patient reports improvement in AROM at end of the session  PT Education - 04/03/18 1928    Education Details  form/technique with exercise; continuing HEP     Person(s) Educated  Patient    Methods  Explanation;Demonstration    Comprehension  Verbalized understanding;Returned demonstration          PT Long Term Goals - 03/18/18 1930      PT LONG TERM GOAL #1   Title  Pt will be independent and compliant with HEP in order to maintain gains made in physical therapy and return to prior level of function.  Baseline  01/29/18: dependent with form and technique; 03/18/2018: moderate cueing to perform exercises correctly    Time  4    Period  Weeks    Status  On-going      PT LONG TERM GOAL #2   Title  Pt will report worse pain in past week less than 6/10 in order to return to prior level of function.    Baseline  01/29/18: 8/10; 03/18/2018: 4/10    Time  4    Period  Weeks    Status  Achieved      PT LONG TERM GOAL #3   Title  Pt will report worse pain in past week less than 4/10 in order to return to prior level of function.    Baseline  01/29/18: 8/10; 03/18/2018: 4/10    Time  6    Period  Weeks    Status  On-going      PT LONG TERM GOAL #4   Title  Pt will report no pain while lifting saddle up onto horse in order to return to prior  level of function and improve quality of life.    Baseline  01/29/18: pt reports pain while lifting saddle onto horse; 03/18/2018: Increased pain to perform    Time  6    Period  Weeks    Status  On-going      PT LONG TERM GOAL #5   Title  Pt will improve FOTO score from 59 to 74 in order to demonstrate functional improvements and improve quality of life.     Baseline  01/29/18: 59; 03/18/2018: 72    Time  6    Period  Weeks    Status  New            Plan - 04/03/18 1933    Clinical Impression Statement  Pt is progressing toward pain and ROM goals. Pt is now able to perform functional tasks such as reaching the lamp on night table with only minor pain. Pt ROM was WNL but continues to c/o pain in posterior shoulder with reaching and moving heavy objects. Pt will continue to benefit from PT to maintain ROM gains and increase strength.     Rehab Potential  Good    Clinical Impairments Affecting Rehab Potential  (+) motivation, age     PT Frequency  2x / week    PT Duration  6 weeks    PT Treatment/Interventions  ADLs/Self Care Home Management;Aquatic Therapy;Biofeedback;Cryotherapy;Air traffic controller;Therapeutic exercise;Therapeutic activities;Functional mobility training;Patient/family education;Manual techniques;Passive range of motion;Dry needling    PT Next Visit Plan  STM to L subscap, sidelying shoulder abduciton with weight     PT Home Exercise Plan  resisted ER with YTB, scapular retractions, YTB shoulder flexion, IR, abduction and extension    Consulted and Agree with Plan of Care  Patient       Patient will benefit from skilled therapeutic intervention in order to improve the following deficits and impairments:  Hypomobility, Decreased mobility, Increased muscle spasms, Decreased range of motion, Improper body mechanics, Postural dysfunction, Impaired UE functional use, Pain  Visit Diagnosis: Chronic left shoulder pain  Stiffness of left  shoulder, not elsewhere classified     Problem List Patient Active Problem List   Diagnosis Date Noted  . Inguinal hernia 12/03/2014  . Arthritis 11/05/2012  . Erectile dysfunction 11/05/2012  . Unspecified essential hypertension 11/05/2012    Karilyn Cota, SPT  Blythe Stanford, PT DPT 04/03/2018, 7:38 PM  [place]  [place] [address] [place], [address] Phone: [phone]   Fax:  [phone]  Name: [name] MRN: [id number] Date of Birth: 08/15/1955

## 2018-04-10 ENCOUNTER — Ambulatory Visit: Payer: 59

## 2018-04-16 ENCOUNTER — Ambulatory Visit: Payer: 59

## 2018-04-16 DIAGNOSIS — G8929 Other chronic pain: Secondary | ICD-10-CM

## 2018-04-16 DIAGNOSIS — M25512 Pain in left shoulder: Principal | ICD-10-CM

## 2018-04-16 DIAGNOSIS — M25612 Stiffness of left shoulder, not elsewhere classified: Secondary | ICD-10-CM

## 2018-04-16 DIAGNOSIS — M62838 Other muscle spasm: Secondary | ICD-10-CM | POA: Diagnosis not present

## 2018-04-16 NOTE — Therapy (Signed)
[facility] [address], [place], [address] Phone: [phone]   Fax:  [phone]  Physical Therapy Treatment  Patient Details  Name: [name] MRN: [id number] Date of Birth: 12-Mar-1956 Referring Provider (PT): Dion Body, MD     Encounter Date: 04/16/2018  PT End of Session - 04/16/18 1658    Visit Number  8    Number of Visits  13    Date for PT Re-Evaluation  04/30/18    PT Start Time  3888    PT Stop Time  1656    PT Time Calculation (min)  41 min    Activity Tolerance  Patient tolerated treatment well    Behavior During Therapy  [facility] for tasks assessed/performed       Past Medical History:  Diagnosis Date  . Arthritis   . Complication of anesthesia    irreg. heartbeat during surgery for hip replacement 2010 (Dr. Einar Gip consulted for abnormal EKG with RBBB but found to be an old finding)  . Heart murmur   . Hypertension    reviewed by Dr. Einar Gip, cleared for surg. on 01/20/2015, ECHO results present   . PFO with atrial septal aneurysm    large PFO v/s small secundum ASD (saw Dr. Einar Gip 12/2014)    Past Surgical History:  Procedure Laterality Date  . INGUINAL HERNIA REPAIR Bilateral 02/09/2015   Procedure: LAPAROSCOPIC BILATERAL INGUINAL HERNIA REPAIRS;  Surgeon: Donnie Mesa, MD;  Location: [facility];  Service: General;  Laterality: Bilateral;  . INSERTION OF MESH Bilateral 02/09/2015   Procedure: INSERTION OF MESH;  Surgeon: Donnie Mesa, MD;  Location: [facility];  Service: General;  Laterality: Bilateral;  . JOINT REPLACEMENT Bilateral 2008, 2010    There were no vitals filed for this visit.  Subjective Assessment - 04/16/18 1630    Subjective  Patient reports he is prepared to be discharged. Patient reports his shoulder is improving overall.     Pertinent History  B THA    Patient Stated Goals  decr pain, be able to lift saddle with no pain,     Currently in Pain?  No/denies        TREATMENT Therapeutic Exercise Shoulder IR at [place] 5# x 20 Shoulder ER at [facility] machine 5# x 20 Shoulder ER at wall with shoulder abducted to 90 - x20 Tricep push downs in [place] - x20 Push ups onto raised table - x 20  Scapular depression at [place] - x 20 5# Lateral rows with 2# -- x 20  Chest press with 10# weights - x 20  Military press - x 20 5# B shoulder flexion - x20 2#  Patient demonstrates increased fatigue at the end of the session    PT Education - 04/16/18 1659    Education Details  Reviewed gym exercises to perform; HEP review    Person(s) Educated  Patient    Methods  Explanation;Demonstration    Comprehension  Verbalized understanding;Returned demonstration          PT Long Term Goals - 04/16/18 1659      PT LONG TERM GOAL #1   Title  Pt will be independent and compliant with HEP in order to maintain gains made in physical therapy and return to prior level of function.    Baseline  01/29/18: dependent with form and technique; 03/18/2018: moderate cueing to perform exercises correctly; 04/16/2018    Time  4    Period  Weeks  Status  Achieved      PT LONG TERM GOAL #2   Title  Pt will report worse pain in past week less than 6/10 in order to return to prior level of function.    Baseline  01/29/18: 8/10; 03/18/2018: 4/10;     Time  4    Period  Weeks    Status  Achieved      PT LONG TERM GOAL #3   Title  Pt will report worse pain in past week less than 4/10 in order to return to prior level of function.    Baseline  01/29/18: 8/10; 03/18/2018: 4/10; 04/16/2018: 3/10    Time  6    Period  Weeks    Status  Achieved      PT LONG TERM GOAL #4   Title  Pt will report no pain while lifting saddle up onto horse in order to return to prior level of function and improve quality of life.    Baseline  01/29/18: pt reports pain while lifting saddle onto horse; 03/18/2018: Increased pain to perform; 04/16/2018: Increased pain but not as much as previous  sessions    Time  6    Period  Weeks    Status  Partially Met      PT LONG TERM GOAL #5   Title  Pt will improve FOTO score from 59 to 74 in order to demonstrate functional improvements and improve quality of life.     Baseline  01/29/18: 59; 03/18/2018: 72    Time  6    Period  Weeks    Status  Deferred            Plan - 04/16/18 1701    Clinical Impression Statement  Patient has either met or partially met all long term goals. Patient demonstrates improvement with overhead motion and is able to perform most gym exercises without pain. Plan is to have patient perform exercises at the gym and at home and see if there is a further need for therapy. Patient is to be discharged in 3 weeks if no aggravation of synmptoms.     Rehab Potential  Good    Clinical Impairments Affecting Rehab Potential  (+) motivation, age     PT Frequency  2x / week    PT Duration  6 weeks    PT Treatment/Interventions  ADLs/Self Care Home Management;Aquatic Therapy;Biofeedback;Cryotherapy;Air traffic controller;Therapeutic exercise;Therapeutic activities;Functional mobility training;Patient/family education;Manual techniques;Passive range of motion;Dry needling    PT Next Visit Plan  STM to L subscap, sidelying shoulder abduciton with weight     PT Home Exercise Plan  resisted ER with YTB, scapular retractions, YTB shoulder flexion, IR, abduction and extension    Consulted and Agree with Plan of Care  Patient       Patient will benefit from skilled therapeutic intervention in order to improve the following deficits and impairments:  Hypomobility, Decreased mobility, Increased muscle spasms, Decreased range of motion, Improper body mechanics, Postural dysfunction, Impaired UE functional use, Pain  Visit Diagnosis: Chronic left shoulder pain  Stiffness of left shoulder, not elsewhere classified  Other muscle spasm     Problem List Patient Active Problem List    Diagnosis Date Noted  . Inguinal hernia 12/03/2014  . Arthritis 11/05/2012  . Erectile dysfunction 11/05/2012  . Unspecified essential hypertension 11/05/2012    Blythe Stanford, PT DPT 04/16/2018, 5:23 PM  [facility] [address],  [place], [address] Phone: [phone]   Fax:  [phone]  Name: [name] MRN: [id number] Date of Birth: Jul 14, 1955

## 2018-05-10 DIAGNOSIS — L820 Inflamed seborrheic keratosis: Secondary | ICD-10-CM | POA: Diagnosis not present

## 2018-05-10 DIAGNOSIS — D229 Melanocytic nevi, unspecified: Secondary | ICD-10-CM | POA: Diagnosis not present

## 2018-05-10 DIAGNOSIS — B359 Dermatophytosis, unspecified: Secondary | ICD-10-CM | POA: Diagnosis not present

## 2018-05-10 DIAGNOSIS — L814 Other melanin hyperpigmentation: Secondary | ICD-10-CM | POA: Diagnosis not present

## 2018-05-10 DIAGNOSIS — D1800 Hemangioma unspecified site: Secondary | ICD-10-CM | POA: Diagnosis not present

## 2018-05-10 DIAGNOSIS — Z1283 Encounter for screening for malignant neoplasm of skin: Secondary | ICD-10-CM | POA: Diagnosis not present

## 2018-05-10 DIAGNOSIS — L738 Other specified follicular disorders: Secondary | ICD-10-CM | POA: Diagnosis not present

## 2018-05-10 DIAGNOSIS — L821 Other seborrheic keratosis: Secondary | ICD-10-CM | POA: Diagnosis not present

## 2018-06-17 ENCOUNTER — Ambulatory Visit (INDEPENDENT_AMBULATORY_CARE_PROVIDER_SITE_OTHER): Payer: Self-pay | Admitting: Physician Assistant

## 2018-06-17 ENCOUNTER — Encounter: Payer: Self-pay | Admitting: Physician Assistant

## 2018-06-17 VITALS — BP 120/80 | HR 70 | Temp 98.1°F | Resp 16 | Ht 68.0 in | Wt 215.0 lb

## 2018-06-17 MED ORDER — PREDNISONE 50 MG PO TABS: 50.0000 mg | ORAL_TABLET | Freq: Every day | ORAL | 0 refills | Status: AC

## 2018-06-17 MED ORDER — BENZONATATE 200 MG PO CAPS: 200.0000 mg | ORAL_CAPSULE | Freq: Two times a day (BID) | ORAL | 0 refills | Status: DC | PRN

## 2018-06-17 MED ORDER — LORATADINE 10 MG PO TABS: 10.0000 mg | ORAL_TABLET | Freq: Every day | ORAL | 0 refills | Status: AC

## 2018-06-17 MED ORDER — OXYMETAZOLINE HCL 0.05 % NA SOLN: 1.0000 | Freq: Two times a day (BID) | NASAL | 0 refills | Status: AC

## 2018-06-17 NOTE — Progress Notes (Signed)
Patient ID: ORRY SIGL DOB: 1956/03/05 AGE: 62 y.o. MRN: [id number]   PCP: No primary care provider on file.   Chief Complaint:  Chief Complaint  Patient presents with  . Nasal Congestion    x4d  . Chest cong    x 2d  . Cough    x4d     Subjective:    HPI:  [name] is a 62 y.o. male presents for evaluation  Chief Complaint  Patient presents with  . Nasal Congestion    x4d  . Chest cong    x 2d  . Cough    x28d   62 year old male presents to [facility] with 4 day history of URI symptoms. Began with nasal congestion. Has since developed chest congestion and coarse cough. Associated chest pressure/heaviness and wheezing (at night, when lying flat). Has been using Flonase nasal spray and took OTC Sudafed with minimal symptom relief. Multiple co-workers currently ill with similar symptoms. 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 Patient with known seasonal allergies. Patient is a former cigarette smoker; quit at age 62, smoked for approximately 15 years. Patient denies asthma, COPD, or emphysema history. Patient concerned because he is about to work 10 days in a row. Denies fever, chills, headache, ear pain, neck pain, sinus pain, sore throat, chest pain, SOB, nausea/vomiting. Patient did receive this season's influenza vaccination. Patient with HTN diagnosis; careful in regards to Sudafed usage. No DM history.  Epic chart review reveals patient with previous history of similar symptoms, typically in June, prescribed Z-pak.  A limited review of symptoms was performed, pertinent positives and negatives as mentioned in HPI.  The following portions of the patient's history were reviewed and updated as appropriate: allergies, current medications and past medical history.  Patient Active Problem List   Diagnosis Date Noted  . Inguinal hernia 12/03/2014  . Arthritis  11/05/2012  . Erectile dysfunction 11/05/2012  . Unspecified essential hypertension 11/05/2012    Allergies  Allergen Reactions  . Morphine And Related Itching    Current Outpatient Medications on File Prior to Visit  Medication Sig Dispense Refill  . amLODipine (NORVASC) 5 MG tablet Take by mouth.    Marland Kitchen aspirin EC 81 MG tablet Take 81 mg by mouth daily.    . Cholecalciferol (VITAMIN D PO) Take by mouth daily.    . fluticasone (FLONASE) 50 MCG/ACT nasal spray Place 2 sprays into both nostrils daily. 16 g 6  . losartan (COZAAR) 50 MG tablet Take 1 tablet (50 mg total) by mouth daily. 90 tablet 3  . Multiple Vitamin (DAILY VITAMINS PO) Take by mouth.    . sildenafil (REVATIO) 20 MG tablet Take 2-3 tablets one hour prior to intercourse 50 tablet 4   No current facility-administered medications on file prior to visit.        Objective:   Vitals:   06/17/18 1058  BP: 120/80  Pulse: 70  Resp: 16  Temp: 98.1 F (36.7 C)  SpO2: 96%     Wt Readings from Last 3 Encounters:  06/17/18 215 lb (97.5 kg)  01/15/16 216 lb 6.4 oz (98.2 kg)  02/09/15 218 lb 6 oz (99.1 kg)    Physical Exam:   General Appearance:  Patient sitting comfortably on examination table. Conversational. Kermit Balo self-historian. In no acute distress. Afebrile.   Head:  Normocephalic, without obvious abnormality, atraumatic  Eyes:  PERRL, conjunctiva/corneas clear,  EOM's intact  Ears:  Bilateral ear canals WNL. No erythema or edema. No discharge/drainage. Bilateral TMs WNL. No erythema, injection, or serous effusion. No scar tissue.  Nose: Nares normal, septum midline. Nasal mucosa reveals bilateral edema with scant clear rhinorrhea. No sinus tenderness with percussion/palpation.  Throat: Lips, mucosa, and tongue normal; teeth and gums normal. Throat reveals no erythema. Tonsils with no enlargement or exudate.  Neck: Supple, symmetrical, trachea midline, no adenopathy  Lungs:   Clear to auscultation bilaterally,  respirations unlabored. Good aeration. No wheezing. Coarse/tight cough elicited with deep inspiration.  Heart:  Regular rate and rhythm, S1 and S2 normal, no murmur, rub, or gallop  Extremities: Extremities normal, atraumatic, no cyanosis or edema  Pulses: 2+ and symmetric  Skin: Skin color, texture, turgor normal, no rashes or lesions  Lymph nodes: Cervical, supraclavicular, and axillary nodes normal  Neurologic: Normal    Assessment & Plan:    Exam findings, diagnosis etiology and medication use and indications reviewed with patient. Follow-Up and discharge instructions provided. No emergent/urgent issues found on exam.  Patient education was provided.   Patient verbalized understanding of information provided and agrees with plan of care (POC), all questions answered. The patient is advised to call or return to clinic if condition does not see an improvement in symptoms, or to seek the care of the closest emergency department if condition worsens with the below plan.    1. Upper respiratory tract infection, unspecified type  2. Nasal congestion  - loratadine (CLARITIN) 10 MG tablet; Take 1 tablet (10 mg total) by mouth daily.  Dispense: 30 tablet; Refill: 0 - oxymetazoline (AFRIN NASAL SPRAY) 0.05 % nasal spray; Place 1 spray into both nostrils 2 (two) times daily for 4 days.  Dispense: 30 mL; Refill: 0  3. Cough  - predniSONE (DELTASONE) 50 MG tablet; Take 1 tablet (50 mg total) by mouth daily with breakfast for 5 days.  Dispense: 5 tablet; Refill: 0 - benzonatate (TESSALON) 200 MG capsule; Take 1 capsule (200 mg total) by mouth 2 (two) times daily as needed for cough.  Dispense: 20 capsule; Refill: 0   Patient with 4 day history of URI symptoms. Began with rhinorrhea/nasal congestion, now primarily chest congestion and cough. VSS. In no acute distress. Suspect patient has self-limited viral URI; however, also believe allergy component. Prescribed Claritin (discussed concern for  urinary retention, patient with BPH symptoms, will monitor closely) and Afrin nasal spray. Prescribed 5-day course of prednisone 50mg  qd for bronchitic cough. Prescribed Tessalon perles for coughing relief. Advised patient follow-up with Leodis Binet, PCP, or urgent care in one week if symptoms not improving; sooner with worsening symptoms. Patient agrees with plan.   Darlin Priestly, MHS, PA-C Montey Hora, MHS, PA-C Advanced Practice Provider [facility]  [address], [facility], [place],  [address] (p):  [phone] [email or web address]

## 2018-06-17 NOTE — Patient Instructions (Signed)
Thank you for choosing [facility] for your health care needs.  You have been diagnosed with an upper respiratory infection (a common cold).  Recommend increase fluids; water, Gatorade, hot tea with lemon/honey, or orange juice. Rest. Use cool mist humidifier in house.  Continue to use Flonase nasal spray. You have been prescribed decongestant nasal spray, Afrin. Use two puffs in both nostrils. May only use for 4 days. Then STOP.  You have been prescribed an antihistamine (allergy pill), Claritin. Take one pill once a day. Stop if any issues with urination.  You have been prescribed a steroid, prednisone. Will help with congestion and cough. Take in the morning (may cause difficulty sleeping). Take with food to prevent stomach upset.  Hope you feel better soon.  Return to [facility], or follow-up with urgent care or family physician in one week if symptoms not improving. Follow-up sooner with any worsening symptoms such as fever, chest pain, shortness of breath, or other new/concerning symptom.  Upper Respiratory Infection, Adult An upper respiratory infection (URI) affects the nose, throat, and upper air passages. URIs are caused by germs (viruses). The most common type of URI is often called "the common cold." Medicines cannot cure URIs, but you can do things at home to relieve your symptoms. URIs usually get better within 7-10 days. Follow these instructions at home: Activity  Rest as needed.  If you have a fever, stay home from work or school until your fever is gone, or until your doctor says you may return to work or school. ? You should stay home until you cannot spread the infection anymore (you are not contagious). ? Your doctor may have you wear a face mask so you have less risk of spreading the infection. Relieving symptoms  Gargle with a salt-water mixture 3-4 times a day or as needed. To make a salt-water mixture, completely dissolve -1 tsp of salt in 1 cup of warm  water.  Use a cool-mist humidifier to add moisture to the air. This can help you breathe more easily. Eating and drinking   Drink enough fluid to keep your pee (urine) pale yellow.  Eat soups and other clear broths. General instructions   Take over-the-counter and prescription medicines only as told by your doctor. These include cold medicines, fever reducers, and cough suppressants.  Do not use any products that contain nicotine or tobacco. These include cigarettes and e-cigarettes. If you need help quitting, ask your doctor.  Avoid being where people are smoking (avoid secondhand smoke).  Make sure you get regular shots and get the flu shot every year.  Keep all follow-up visits as told by your doctor. This is important. How to avoid spreading infection to others   Wash your hands often with soap and water. If you do not have soap and water, use hand sanitizer.  Avoid touching your mouth, face, eyes, or nose.  Cough or sneeze into a tissue or your sleeve or elbow. Do not cough or sneeze into your hand or into the air. Contact a doctor if:  You are getting worse, not better.  You have any of these: ? A fever. ? Chills. ? Brown or red mucus in your nose. ? Yellow or brown fluid (discharge)coming from your nose. ? Pain in your face, especially when you bend forward. ? Swollen neck glands. ? Pain with swallowing. ? White areas in the back of your throat. Get help right away if:  You have shortness of breath that gets worse.  You have  very bad or constant: ? Headache. ? Ear pain. ? Pain in your forehead, behind your eyes, and over your cheekbones (sinus pain). ? Chest pain.  You have long-lasting (chronic) lung disease along with any of these: ? Wheezing. ? Long-lasting cough. ? Coughing up blood. ? A change in your usual mucus.  You have a stiff neck.  You have changes in your: ? Vision. ? Hearing. ? Thinking. ? Mood. Summary  An upper respiratory  infection (URI) is caused by a germ called a virus. The most common type of URI is often called "the common cold."  URIs usually get better within 7-10 days.  Take over-the-counter and prescription medicines only as told by your doctor. This information is not intended to replace advice given to you by your health care provider. Make sure you discuss any questions you have with your health care provider. Document Released: 11/29/2007 Document Revised: 02/02/2017 Document Reviewed: 02/02/2017 Elsevier Interactive Patient Education  2019 [employer].

## 2018-07-18 DIAGNOSIS — L820 Inflamed seborrheic keratosis: Secondary | ICD-10-CM | POA: Diagnosis not present

## 2018-07-18 DIAGNOSIS — L821 Other seborrheic keratosis: Secondary | ICD-10-CM | POA: Diagnosis not present

## 2018-07-25 DIAGNOSIS — I10 Essential (primary) hypertension: Secondary | ICD-10-CM | POA: Diagnosis not present

## 2018-07-25 DIAGNOSIS — N183 Chronic kidney disease, stage 3 (moderate): Secondary | ICD-10-CM | POA: Diagnosis not present

## 2018-07-25 DIAGNOSIS — R5383 Other fatigue: Secondary | ICD-10-CM | POA: Diagnosis not present

## 2018-07-25 DIAGNOSIS — Z0000 Encounter for general adult medical examination without abnormal findings: Secondary | ICD-10-CM | POA: Diagnosis not present

## 2018-08-01 DIAGNOSIS — R7303 Prediabetes: Secondary | ICD-10-CM | POA: Diagnosis not present

## 2018-08-01 DIAGNOSIS — I10 Essential (primary) hypertension: Secondary | ICD-10-CM | POA: Diagnosis not present

## 2018-08-01 DIAGNOSIS — Z0000 Encounter for general adult medical examination without abnormal findings: Secondary | ICD-10-CM | POA: Diagnosis not present

## 2018-08-01 DIAGNOSIS — E669 Obesity, unspecified: Secondary | ICD-10-CM | POA: Diagnosis not present

## 2018-08-05 ENCOUNTER — Telehealth: Payer: Self-pay | Admitting: *Deleted

## 2018-08-05 NOTE — Telephone Encounter (Signed)
Received referral for low dose lung cancer screening CT scan. Contacted patient and reviewed smoking history which includes quit date > 15 years ago. Patient verbalizes understanding that he is not a candidate for lung cancer screening due to this.

## 2018-08-22 DIAGNOSIS — L821 Other seborrheic keratosis: Secondary | ICD-10-CM | POA: Diagnosis not present

## 2018-08-22 DIAGNOSIS — L570 Actinic keratosis: Secondary | ICD-10-CM | POA: Diagnosis not present

## 2018-09-19 DIAGNOSIS — N403 Nodular prostate with lower urinary tract symptoms: Secondary | ICD-10-CM | POA: Diagnosis not present

## 2018-09-19 DIAGNOSIS — R3915 Urgency of urination: Secondary | ICD-10-CM | POA: Diagnosis not present

## 2018-09-19 DIAGNOSIS — N5201 Erectile dysfunction due to arterial insufficiency: Secondary | ICD-10-CM | POA: Diagnosis not present

## 2018-12-05 DIAGNOSIS — E669 Obesity, unspecified: Secondary | ICD-10-CM | POA: Diagnosis not present

## 2018-12-05 DIAGNOSIS — R7303 Prediabetes: Secondary | ICD-10-CM | POA: Diagnosis not present

## 2018-12-05 DIAGNOSIS — N528 Other male erectile dysfunction: Secondary | ICD-10-CM | POA: Diagnosis not present

## 2018-12-05 DIAGNOSIS — I10 Essential (primary) hypertension: Secondary | ICD-10-CM | POA: Diagnosis not present

## 2019-01-09 DIAGNOSIS — H52203 Unspecified astigmatism, bilateral: Secondary | ICD-10-CM | POA: Diagnosis not present

## 2019-01-09 DIAGNOSIS — H25013 Cortical age-related cataract, bilateral: Secondary | ICD-10-CM | POA: Diagnosis not present

## 2019-01-09 DIAGNOSIS — H524 Presbyopia: Secondary | ICD-10-CM | POA: Diagnosis not present

## 2019-01-09 DIAGNOSIS — H5203 Hypermetropia, bilateral: Secondary | ICD-10-CM | POA: Diagnosis not present

## 2019-01-09 DIAGNOSIS — H2513 Age-related nuclear cataract, bilateral: Secondary | ICD-10-CM | POA: Diagnosis not present

## 2019-01-13 DIAGNOSIS — M7022 Olecranon bursitis, left elbow: Secondary | ICD-10-CM | POA: Diagnosis not present

## 2019-01-31 DIAGNOSIS — M7022 Olecranon bursitis, left elbow: Secondary | ICD-10-CM | POA: Diagnosis not present

## 2019-02-11 DIAGNOSIS — M778 Other enthesopathies, not elsewhere classified: Secondary | ICD-10-CM | POA: Diagnosis not present

## 2019-02-11 DIAGNOSIS — M7022 Olecranon bursitis, left elbow: Secondary | ICD-10-CM | POA: Diagnosis not present

## 2019-02-11 DIAGNOSIS — M25522 Pain in left elbow: Secondary | ICD-10-CM | POA: Diagnosis not present

## 2019-05-29 DIAGNOSIS — D2261 Melanocytic nevi of right upper limb, including shoulder: Secondary | ICD-10-CM | POA: Diagnosis not present

## 2019-05-29 DIAGNOSIS — L820 Inflamed seborrheic keratosis: Secondary | ICD-10-CM | POA: Diagnosis not present

## 2019-05-29 DIAGNOSIS — L570 Actinic keratosis: Secondary | ICD-10-CM | POA: Diagnosis not present

## 2019-05-29 DIAGNOSIS — B078 Other viral warts: Secondary | ICD-10-CM | POA: Diagnosis not present

## 2019-05-29 DIAGNOSIS — D2262 Melanocytic nevi of left upper limb, including shoulder: Secondary | ICD-10-CM | POA: Diagnosis not present

## 2019-05-29 DIAGNOSIS — D1801 Hemangioma of skin and subcutaneous tissue: Secondary | ICD-10-CM | POA: Diagnosis not present

## 2019-05-29 DIAGNOSIS — L821 Other seborrheic keratosis: Secondary | ICD-10-CM | POA: Diagnosis not present

## 2019-05-29 DIAGNOSIS — Z1283 Encounter for screening for malignant neoplasm of skin: Secondary | ICD-10-CM | POA: Diagnosis not present

## 2019-05-29 DIAGNOSIS — D225 Melanocytic nevi of trunk: Secondary | ICD-10-CM | POA: Diagnosis not present

## 2019-07-30 DIAGNOSIS — L578 Other skin changes due to chronic exposure to nonionizing radiation: Secondary | ICD-10-CM | POA: Diagnosis not present

## 2019-07-30 DIAGNOSIS — L820 Inflamed seborrheic keratosis: Secondary | ICD-10-CM | POA: Diagnosis not present

## 2019-07-30 DIAGNOSIS — Z872 Personal history of diseases of the skin and subcutaneous tissue: Secondary | ICD-10-CM | POA: Diagnosis not present

## 2019-08-26 DIAGNOSIS — Z0000 Encounter for general adult medical examination without abnormal findings: Secondary | ICD-10-CM | POA: Diagnosis not present

## 2019-08-26 DIAGNOSIS — I10 Essential (primary) hypertension: Secondary | ICD-10-CM | POA: Diagnosis not present

## 2019-08-26 DIAGNOSIS — E669 Obesity, unspecified: Secondary | ICD-10-CM | POA: Diagnosis not present

## 2019-09-02 DIAGNOSIS — I10 Essential (primary) hypertension: Secondary | ICD-10-CM | POA: Diagnosis not present

## 2019-09-02 DIAGNOSIS — N1831 Chronic kidney disease, stage 3a: Secondary | ICD-10-CM | POA: Diagnosis not present

## 2019-09-02 DIAGNOSIS — E669 Obesity, unspecified: Secondary | ICD-10-CM | POA: Diagnosis not present

## 2019-09-02 DIAGNOSIS — Z0000 Encounter for general adult medical examination without abnormal findings: Secondary | ICD-10-CM | POA: Diagnosis not present

## 2019-12-03 ENCOUNTER — Other Ambulatory Visit: Payer: Self-pay | Admitting: Family Medicine

## 2020-01-09 DIAGNOSIS — H2513 Age-related nuclear cataract, bilateral: Secondary | ICD-10-CM | POA: Diagnosis not present

## 2020-01-09 DIAGNOSIS — H52203 Unspecified astigmatism, bilateral: Secondary | ICD-10-CM | POA: Diagnosis not present

## 2020-01-09 DIAGNOSIS — H25013 Cortical age-related cataract, bilateral: Secondary | ICD-10-CM | POA: Diagnosis not present

## 2020-01-09 DIAGNOSIS — H25813 Combined forms of age-related cataract, bilateral: Secondary | ICD-10-CM | POA: Diagnosis not present

## 2020-01-09 DIAGNOSIS — H5203 Hypermetropia, bilateral: Secondary | ICD-10-CM | POA: Diagnosis not present

## 2020-01-09 DIAGNOSIS — H524 Presbyopia: Secondary | ICD-10-CM | POA: Diagnosis not present

## 2020-02-18 ENCOUNTER — Other Ambulatory Visit: Payer: Self-pay | Admitting: Family Medicine

## 2020-02-26 DIAGNOSIS — I10 Essential (primary) hypertension: Secondary | ICD-10-CM | POA: Diagnosis not present

## 2020-03-04 DIAGNOSIS — E669 Obesity, unspecified: Secondary | ICD-10-CM | POA: Diagnosis not present

## 2020-03-04 DIAGNOSIS — N1831 Chronic kidney disease, stage 3a: Secondary | ICD-10-CM | POA: Diagnosis not present

## 2020-03-04 DIAGNOSIS — I10 Essential (primary) hypertension: Secondary | ICD-10-CM | POA: Diagnosis not present

## 2020-03-04 DIAGNOSIS — Z0000 Encounter for general adult medical examination without abnormal findings: Secondary | ICD-10-CM | POA: Diagnosis not present

## 2020-03-28 ENCOUNTER — Other Ambulatory Visit: Payer: Self-pay | Admitting: Family Medicine

## 2020-03-28 DIAGNOSIS — B356 Tinea cruris: Secondary | ICD-10-CM | POA: Diagnosis not present

## 2020-03-28 DIAGNOSIS — L509 Urticaria, unspecified: Secondary | ICD-10-CM | POA: Diagnosis not present

## 2020-05-12 ENCOUNTER — Other Ambulatory Visit: Payer: Self-pay | Admitting: Family Medicine

## 2020-05-27 ENCOUNTER — Encounter: Payer: Self-pay | Admitting: Dermatology

## 2020-06-29 ENCOUNTER — Other Ambulatory Visit: Payer: Self-pay

## 2020-06-29 ENCOUNTER — Ambulatory Visit: Payer: 59 | Admitting: Dermatology

## 2020-06-29 DIAGNOSIS — D235 Other benign neoplasm of skin of trunk: Secondary | ICD-10-CM

## 2020-06-29 DIAGNOSIS — D239 Other benign neoplasm of skin, unspecified: Secondary | ICD-10-CM

## 2020-06-29 DIAGNOSIS — Z1283 Encounter for screening for malignant neoplasm of skin: Secondary | ICD-10-CM | POA: Diagnosis not present

## 2020-06-29 DIAGNOSIS — L821 Other seborrheic keratosis: Secondary | ICD-10-CM

## 2020-06-29 DIAGNOSIS — Z872 Personal history of diseases of the skin and subcutaneous tissue: Secondary | ICD-10-CM | POA: Diagnosis not present

## 2020-06-29 DIAGNOSIS — L820 Inflamed seborrheic keratosis: Secondary | ICD-10-CM | POA: Diagnosis not present

## 2020-06-29 DIAGNOSIS — D1800 Hemangioma unspecified site: Secondary | ICD-10-CM | POA: Diagnosis not present

## 2020-06-29 DIAGNOSIS — D229 Melanocytic nevi, unspecified: Secondary | ICD-10-CM

## 2020-06-29 DIAGNOSIS — L814 Other melanin hyperpigmentation: Secondary | ICD-10-CM

## 2020-06-29 DIAGNOSIS — D2372 Other benign neoplasm of skin of left lower limb, including hip: Secondary | ICD-10-CM | POA: Diagnosis not present

## 2020-06-29 DIAGNOSIS — L578 Other skin changes due to chronic exposure to nonionizing radiation: Secondary | ICD-10-CM

## 2020-06-29 DIAGNOSIS — D485 Neoplasm of uncertain behavior of skin: Secondary | ICD-10-CM

## 2020-06-29 NOTE — Patient Instructions (Addendum)
Melanoma ABCDEs  Melanoma is the most dangerous type of skin cancer, and is the leading cause of death from skin disease.  You are more likely to develop melanoma if you:  Have light-colored skin, light-colored eyes, or red or blond hair  Spend a lot of time in the sun  Tan regularly, either outdoors or in a tanning bed  Have had blistering sunburns, especially during childhood  Have a close family member who has had a melanoma  Have atypical moles or large birthmarks  Early detection of melanoma is key since treatment is typically straightforward and cure rates are extremely high if we catch it early.   The first sign of melanoma is often a change in a mole or a new dark spot.  The ABCDE system is a way of remembering the signs of melanoma.  A for asymmetry:  The two halves do not match. B for border:  The edges of the growth are irregular. C for color:  A mixture of colors are present instead of an even brown color. D for diameter:  Melanomas are usually (but not always) greater than 62mm - the size of a pencil eraser. E for evolution:  The spot keeps changing in size, shape, and color.  Please check your skin once per month between visits. You can use a small mirror in front and a large mirror behind you to keep an eye on the back side or your body.   If you see any new or changing lesions before your next follow-up, please call to schedule a visit.  Please continue daily skin protection including broad spectrum sunscreen SPF 30+ to sun-exposed areas, reapplying every 2 hours as needed when you're outdoors.   Seborrheic Keratosis  What causes seborrheic keratoses? Seborrheic keratoses are harmless, common skin growths that first appear during adult life.  As time goes by, more growths appear.  Some people may develop a large number of them.  Seborrheic keratoses appear on both covered and uncovered body parts.  They are not caused by sunlight.  The tendency to develop seborrheic  keratoses can be inherited.  They vary in color from skin-colored to gray, brown, or even black.  They can be either smooth or have a rough, warty surface.   Seborrheic keratoses are superficial and look as if they were stuck on the skin.  Under the microscope this type of keratosis looks like layers upon layers of skin.  That is why at times the top layer may seem to fall off, but the rest of the growth remains and re-grows.    Treatment Seborrheic keratoses do not need to be treated, but can easily be removed in the office.  Seborrheic keratoses often cause symptoms when they rub on clothing or jewelry.  Lesions can be in the way of shaving.  If they become inflamed, they can cause itching, soreness, or burning.  Removal of a seborrheic keratosis can be accomplished by freezing, burning, or surgery. If any spot bleeds, scabs, or grows rapidly, please return to have it checked, as these can be an indication of a skin cancer.  Cryotherapy Aftercare  . Wash gently with soap and water everyday.   Marland Kitchen Apply Vaseline and Band-Aid daily until healed.  Prior to procedure, discussed risks of blister formation, small wound, skin dyspigmentation, or rare scar following cryotherapy.    Recommend taking Heliocare sun protection supplement daily in sunny weather for additional sun protection. For maximum protection on the sunniest days, you can take  up to 2 capsules of regular Heliocare OR take 1 capsule of Heliocare Ultra. For prolonged exposure (such as a full day in the sun), you can repeat your dose of the supplement 4 hours after your first dose. Heliocare can be purchased at [facility] or at [email or web address].

## 2020-06-29 NOTE — Progress Notes (Signed)
Follow-Up Visit   Subjective  [name] is a 65 y.o. male who presents for the following: TBSE (Patient here today for TBSE, no history of skin cancer. Patient does have a history of AK's. ).  Patient advises he does have a spot at right scalp and one at left neck, both present for about 6 months, no symptoms. There is also a spot at nasal tip that has been there for about 1 week and is like a pimple and is now starting to heal.   Patient here for full body skin exam and skin cancer screening. Patient does have irritated spots at right neck, right shoulder and right forearm.   The following portions of the chart were reviewed this encounter and updated as appropriate:   Tobacco  Allergies  Meds  Problems  Med Hx  Surg Hx  Fam Hx      Review of Systems:  No other skin or systemic complaints except as noted in HPI or Assessment and Plan.  Objective  Well appearing patient in no apparent distress; mood and affect are within normal limits.  A full examination was performed including scalp, head, eyes, ears, nose, lips, neck, chest, axillae, abdomen, back, buttocks, bilateral upper extremities, bilateral lower extremities, hands, feet, fingers, toes, fingernails, and toenails. All findings within normal limits unless otherwise noted below.  Objective  Left shin: Firm pink/brown papulenodule with dimple sign.   Objective  Right Neck x 3, right forearm (4): Erythematous keratotic or waxy stuck-on papule or plaque.   Right posterior shoulder  Objective  Right posterior shoulder: Firm pink/brown papule nodule with dimple sign.    Assessment & Plan  Dermatofibroma Left shin  Benign-appearing.  Observation.  Call clinic for new or changing lesions.    Inflamed seborrheic keratosis (4) Right Neck x 3, right forearm  Pedunculated ISK over Skin Tag. Symptomatic.  Prior to procedure, discussed risks of blister formation, small wound, skin dyspigmentation, or rare  scar following cryotherapy.    Destruction of lesion - Right Neck x 3, right forearm Complexity: simple   Destruction method: cryotherapy   Informed consent: discussed and consent obtained   Lesion destroyed using liquid nitrogen: Yes   Cryotherapy cycles:  2 Outcome: patient tolerated procedure well with no complications   Post-procedure details: wound care instructions given    Benign neoplasm of skin of trunk, except scrotum Right posterior shoulder  Favor dermatofibroma.   Symptomatic  Destruction of lesion - Right posterior shoulder Complexity: simple   Destruction method: cryotherapy   Informed consent: discussed and consent obtained   Lesion destroyed using liquid nitrogen: Yes   Cryotherapy cycles:  2 Outcome: patient tolerated procedure well with no complications   Post-procedure details: wound care instructions given     Lentigines - Scattered tan macules - Discussed due to sun exposure - Benign, observe - Call for any changes  Seborrheic Keratoses - Stuck-on, waxy, tan-brown papules and plaques  - Discussed benign etiology and prognosis. - Observe - Call for any changes  Melanocytic Nevi - Tan-brown and/or pink-flesh-colored symmetric macules and papules - Benign appearing on exam today - Observation - Call clinic for new or changing moles - Recommend daily use of broad spectrum spf 30+ sunscreen to sun-exposed areas.   Hemangiomas - Red papules - Discussed benign nature - Observe - Call for any changes  Actinic Damage - Chronic, secondary to cumulative UV/sun exposure - diffuse scaly erythematous macules with underlying dyspigmentation - Recommend daily broad spectrum  sunscreen SPF 30+ to sun-exposed areas, reapply every 2 hours as needed.  - Call for new or changing lesions.  Skin cancer screening performed today.  History of PreCancerous Actinic Keratosis  - sites of PreCancerous Actinic Keratosis clear today. - these may recur and new  lesions may form requiring treatment to prevent transformation into skin cancer - observe for new or changing spots and contact [place] for appointment if occur - photoprotection with sun protective clothing; sunglasses and broad spectrum sunscreen with SPF of at least 30 + and frequent self skin exams recommended - yearly exams by a dermatologist recommended for persons with history of PreCancerous Actinic Keratoses  Return in about 1 year (around 06/29/2021) for TBSE.  Graciella Belton, RMA, am acting as scribe for Forest Gleason, MD .  Documentation: I have reviewed the above documentation for accuracy and completeness, and I agree with the above.  Forest Gleason, MD

## 2020-07-07 ENCOUNTER — Other Ambulatory Visit: Payer: Self-pay

## 2020-07-20 ENCOUNTER — Encounter: Payer: Self-pay | Admitting: Dermatology

## 2020-08-02 ENCOUNTER — Other Ambulatory Visit: Payer: Self-pay | Admitting: Family Medicine

## 2020-08-17 ENCOUNTER — Other Ambulatory Visit: Payer: Self-pay | Admitting: Family Medicine

## 2020-08-27 DIAGNOSIS — Z0000 Encounter for general adult medical examination without abnormal findings: Secondary | ICD-10-CM | POA: Diagnosis not present

## 2020-08-27 DIAGNOSIS — N1831 Chronic kidney disease, stage 3a: Secondary | ICD-10-CM | POA: Diagnosis not present

## 2020-08-27 DIAGNOSIS — I10 Essential (primary) hypertension: Secondary | ICD-10-CM | POA: Diagnosis not present

## 2020-09-03 DIAGNOSIS — N1831 Chronic kidney disease, stage 3a: Secondary | ICD-10-CM | POA: Diagnosis not present

## 2020-09-03 DIAGNOSIS — E669 Obesity, unspecified: Secondary | ICD-10-CM | POA: Diagnosis not present

## 2020-09-03 DIAGNOSIS — I10 Essential (primary) hypertension: Secondary | ICD-10-CM | POA: Diagnosis not present

## 2020-09-03 DIAGNOSIS — Z0000 Encounter for general adult medical examination without abnormal findings: Secondary | ICD-10-CM | POA: Diagnosis not present

## 2020-10-27 ENCOUNTER — Other Ambulatory Visit: Payer: Self-pay

## 2020-10-27 MED FILL — Amlodipine Besylate Tab 5 MG (Base Equivalent): ORAL | 90 days supply | Qty: 90 | Fill #0 | Status: AC

## 2020-11-11 MED FILL — Losartan Potassium Tab 50 MG: ORAL | 90 days supply | Qty: 90 | Fill #0 | Status: AC

## 2020-11-12 ENCOUNTER — Other Ambulatory Visit: Payer: Self-pay

## 2020-12-20 ENCOUNTER — Other Ambulatory Visit: Payer: Self-pay

## 2020-12-21 ENCOUNTER — Other Ambulatory Visit: Payer: Self-pay

## 2020-12-21 MED ORDER — FLUTICASONE PROPIONATE 50 MCG/ACT NA SUSP
NASAL | 1 refills | Status: DC
  Filled 2020-12-21: qty 48, 90d supply, fill #0
  Filled 2021-03-25: qty 48, 90d supply, fill #1

## 2021-01-27 ENCOUNTER — Other Ambulatory Visit: Payer: Self-pay

## 2021-01-28 ENCOUNTER — Other Ambulatory Visit: Payer: Self-pay

## 2021-01-28 MED ORDER — AMLODIPINE BESYLATE 5 MG PO TABS
ORAL_TABLET | ORAL | 1 refills | Status: DC
  Filled 2021-01-28: qty 90, 90d supply, fill #0
  Filled 2021-04-27: qty 90, 90d supply, fill #1

## 2021-02-08 DIAGNOSIS — H2513 Age-related nuclear cataract, bilateral: Secondary | ICD-10-CM | POA: Diagnosis not present

## 2021-02-08 DIAGNOSIS — H25013 Cortical age-related cataract, bilateral: Secondary | ICD-10-CM | POA: Diagnosis not present

## 2021-02-08 DIAGNOSIS — H5203 Hypermetropia, bilateral: Secondary | ICD-10-CM | POA: Diagnosis not present

## 2021-02-08 DIAGNOSIS — H52203 Unspecified astigmatism, bilateral: Secondary | ICD-10-CM | POA: Diagnosis not present

## 2021-02-08 DIAGNOSIS — H524 Presbyopia: Secondary | ICD-10-CM | POA: Diagnosis not present

## 2021-02-10 ENCOUNTER — Other Ambulatory Visit: Payer: Self-pay

## 2021-02-11 ENCOUNTER — Other Ambulatory Visit: Payer: Self-pay

## 2021-02-11 MED ORDER — LOSARTAN POTASSIUM 50 MG PO TABS
50.0000 mg | ORAL_TABLET | Freq: Every day | ORAL | 0 refills | Status: DC
  Filled 2021-02-11: qty 90, 90d supply, fill #0

## 2021-02-15 ENCOUNTER — Other Ambulatory Visit: Payer: Self-pay

## 2021-02-15 MED FILL — Amoxicillin (Trihydrate) Cap 500 MG: ORAL | 2 days supply | Qty: 8 | Fill #0 | Status: AC

## 2021-03-01 DIAGNOSIS — N1831 Chronic kidney disease, stage 3a: Secondary | ICD-10-CM | POA: Diagnosis not present

## 2021-03-01 DIAGNOSIS — I10 Essential (primary) hypertension: Secondary | ICD-10-CM | POA: Diagnosis not present

## 2021-03-01 DIAGNOSIS — Z1159 Encounter for screening for other viral diseases: Secondary | ICD-10-CM | POA: Diagnosis not present

## 2021-03-07 DIAGNOSIS — I10 Essential (primary) hypertension: Secondary | ICD-10-CM | POA: Diagnosis not present

## 2021-03-07 DIAGNOSIS — Z136 Encounter for screening for cardiovascular disorders: Secondary | ICD-10-CM | POA: Diagnosis not present

## 2021-03-07 DIAGNOSIS — E669 Obesity, unspecified: Secondary | ICD-10-CM | POA: Diagnosis not present

## 2021-03-25 ENCOUNTER — Other Ambulatory Visit: Payer: Self-pay

## 2021-04-27 ENCOUNTER — Other Ambulatory Visit: Payer: Self-pay

## 2021-04-28 ENCOUNTER — Other Ambulatory Visit: Payer: Self-pay

## 2021-04-29 ENCOUNTER — Other Ambulatory Visit: Payer: Self-pay

## 2021-05-02 ENCOUNTER — Other Ambulatory Visit: Payer: Self-pay

## 2021-05-02 DIAGNOSIS — M2011 Hallux valgus (acquired), right foot: Secondary | ICD-10-CM | POA: Diagnosis not present

## 2021-05-02 DIAGNOSIS — M19071 Primary osteoarthritis, right ankle and foot: Secondary | ICD-10-CM | POA: Diagnosis not present

## 2021-05-02 DIAGNOSIS — I10 Essential (primary) hypertension: Secondary | ICD-10-CM | POA: Diagnosis not present

## 2021-05-02 DIAGNOSIS — M7731 Calcaneal spur, right foot: Secondary | ICD-10-CM | POA: Diagnosis not present

## 2021-05-02 DIAGNOSIS — Z23 Encounter for immunization: Secondary | ICD-10-CM | POA: Diagnosis not present

## 2021-05-02 DIAGNOSIS — S91331A Puncture wound without foreign body, right foot, initial encounter: Secondary | ICD-10-CM | POA: Diagnosis not present

## 2021-05-02 MED ORDER — SILDENAFIL CITRATE 20 MG PO TABS
ORAL_TABLET | ORAL | 3 refills | Status: AC
  Filled 2021-05-02: qty 30, 6d supply, fill #0
  Filled 2021-11-06: qty 30, 6d supply, fill #1
  Filled 2022-02-01 – 2022-02-03 (×2): qty 30, 6d supply, fill #2

## 2021-05-02 MED ORDER — CIPROFLOXACIN HCL 500 MG PO TABS
ORAL_TABLET | ORAL | 0 refills | Status: DC
  Filled 2021-05-02: qty 10, 5d supply, fill #0

## 2021-05-02 MED ORDER — AMOXICILLIN-POT CLAVULANATE 875-125 MG PO TABS
ORAL_TABLET | ORAL | 0 refills | Status: DC
  Filled 2021-05-02: qty 10, 5d supply, fill #0

## 2021-05-05 ENCOUNTER — Other Ambulatory Visit: Payer: Self-pay

## 2021-05-09 ENCOUNTER — Other Ambulatory Visit: Payer: Self-pay

## 2021-05-10 ENCOUNTER — Other Ambulatory Visit: Payer: Self-pay

## 2021-05-10 MED ORDER — LOSARTAN POTASSIUM 50 MG PO TABS
50.0000 mg | ORAL_TABLET | Freq: Every day | ORAL | 1 refills | Status: DC
  Filled 2021-05-10: qty 90, 90d supply, fill #0
  Filled 2021-08-08: qty 90, 90d supply, fill #1

## 2021-06-29 ENCOUNTER — Encounter: Payer: Self-pay | Admitting: Dermatology

## 2021-06-29 ENCOUNTER — Other Ambulatory Visit: Payer: Self-pay

## 2021-06-29 ENCOUNTER — Ambulatory Visit: Payer: 59 | Admitting: Dermatology

## 2021-06-29 DIAGNOSIS — D1801 Hemangioma of skin and subcutaneous tissue: Secondary | ICD-10-CM

## 2021-06-29 DIAGNOSIS — D2372 Other benign neoplasm of skin of left lower limb, including hip: Secondary | ICD-10-CM | POA: Diagnosis not present

## 2021-06-29 DIAGNOSIS — D229 Melanocytic nevi, unspecified: Secondary | ICD-10-CM | POA: Diagnosis not present

## 2021-06-29 DIAGNOSIS — L821 Other seborrheic keratosis: Secondary | ICD-10-CM

## 2021-06-29 DIAGNOSIS — D2361 Other benign neoplasm of skin of right upper limb, including shoulder: Secondary | ICD-10-CM | POA: Diagnosis not present

## 2021-06-29 DIAGNOSIS — L578 Other skin changes due to chronic exposure to nonionizing radiation: Secondary | ICD-10-CM | POA: Diagnosis not present

## 2021-06-29 DIAGNOSIS — Z1283 Encounter for screening for malignant neoplasm of skin: Secondary | ICD-10-CM

## 2021-06-29 DIAGNOSIS — L570 Actinic keratosis: Secondary | ICD-10-CM | POA: Diagnosis not present

## 2021-06-29 DIAGNOSIS — L814 Other melanin hyperpigmentation: Secondary | ICD-10-CM | POA: Diagnosis not present

## 2021-06-29 NOTE — Progress Notes (Signed)
Follow-Up Visit   Subjective  [name] is a 66 y.o. male who presents for the following: Follow-up (Patient here today for 1 year tbse. Patient reports that he has a spot on right thigh he would like checked. ).  Patient here for full body skin exam and skin cancer screening.  The following portions of the chart were reviewed this encounter and updated as appropriate:  Tobacco   Allergies   Meds   Problems   Med Hx   Surg Hx   Fam Hx       Review of Systems: No other skin or systemic complaints except as noted in HPI or Assessment and Plan.   Objective  Well appearing patient in no apparent distress; mood and affect are within normal limits.  A full examination was performed including scalp, head, eyes, ears, nose, lips, neck, chest, axillae, abdomen, back, buttocks, bilateral upper extremities, bilateral lower extremities, hands, feet, fingers, toes, fingernails, and toenails. All findings within normal limits unless otherwise noted below.  right cheek x 1, right jaw x 1 (2) Erythematous thin papules/macules with gritty scale.    Assessment & Plan  Actinic keratosis (2) right cheek x 1, right jaw x 1  Actinic keratoses are precancerous spots that appear secondary to cumulative UV radiation exposure/sun exposure over time. They are chronic with expected duration over 1 year. A portion of actinic keratoses will progress to squamous cell carcinoma of the skin. It is not possible to reliably predict which spots will progress to skin cancer and so treatment is recommended to prevent development of skin cancer.  Recommend daily broad spectrum sunscreen SPF 30+ to sun-exposed areas, reapply every 2 hours as needed.  Recommend staying in the shade or wearing long sleeves, sun glasses (UVA+UVB protection) and wide brim hats (4-inch brim around the entire circumference of the hat). Call for new or changing lesions.  Prior to procedure, discussed risks of blister formation, small  wound, skin dyspigmentation, or rare scar following cryotherapy. Recommend Vaseline ointment to treated areas while healing.   Destruction of lesion - right cheek x 1, right jaw x 1  Destruction method: cryotherapy   Informed consent: discussed and consent obtained   Lesion destroyed using liquid nitrogen: Yes   Region frozen until ice ball extended beyond lesion: Yes   Outcome: patient tolerated procedure well with no complications   Post-procedure details: wound care instructions given   Additional details:  Prior to procedure, discussed risks of blister formation, small wound, skin dyspigmentation, or rare scar following cryotherapy. Recommend Vaseline ointment to treated areas while healing.   Lentigines - Scattered tan macules - Due to sun exposure - Benign-appearing, observe - Recommend daily broad spectrum sunscreen SPF 30+ to sun-exposed areas, reapply every 2 hours as needed. - Call for any changes  Seborrheic Keratoses - Stuck-on, waxy, tan-brown papules and/or plaques  - Benign-appearing - Discussed benign etiology and prognosis. - Observe - Call for any changes  Melanocytic Nevi - Tan-brown and/or pink-flesh-colored symmetric macules and papules - Benign appearing on exam today - Observation - Call clinic for new or changing moles - Recommend daily use of broad spectrum spf 30+ sunscreen to sun-exposed areas.   Hemangiomas - Red papules - Discussed benign nature - Observe - Call for any changes  Dermatofibroma - Firm pink/brown papulenodule with dimple sign left shin, right posterior shoulder - Benign appearing - Call for any changes  Actinic Damage - Chronic condition, secondary to cumulative UV/sun exposure -  diffuse scaly erythematous macules with underlying dyspigmentation - Recommend daily broad spectrum sunscreen SPF 30+ to sun-exposed areas, reapply every 2 hours as needed.  - Staying in the shade or wearing long sleeves, sun glasses (UVA+UVB  protection) and wide brim hats (4-inch brim around the entire circumference of the hat) are also recommended for sun protection.  - Call for new or changing lesions.  Skin cancer screening performed today. Return for 1 year tbse . I, Ruthell Rummage, CMA, am acting as scribe for Forest Gleason, MD.  Documentation: I have reviewed the above documentation for accuracy and completeness, and I agree with the above.  Forest Gleason, MD

## 2021-06-29 NOTE — Patient Instructions (Addendum)
Actinic keratoses are precancerous spots that appear secondary to cumulative UV radiation exposure/sun exposure over time. They are chronic with expected duration over 1 year. A portion of actinic keratoses will progress to squamous cell carcinoma of the skin. It is not possible to reliably predict which spots will progress to skin cancer and so treatment is recommended to prevent development of skin cancer.  Recommend daily broad spectrum sunscreen SPF 30+ to sun-exposed areas, reapply every 2 hours as needed.  Recommend staying in the shade or wearing long sleeves, sun glasses (UVA+UVB protection) and wide brim hats (4-inch brim around the entire circumference of the hat). Call for new or changing lesions.   Cryotherapy Aftercare  Wash gently with soap and water everyday.   Apply Vaseline and Band-Aid daily until healed.    Recommend taking Heliocare sun protection supplement daily in sunny weather for additional sun protection. For maximum protection on the sunniest days, you can take up to 2 capsules of regular Heliocare OR take 1 capsule of Heliocare Ultra. For prolonged exposure (such as a full day in the sun), you can repeat your dose of the supplement 4 hours after your first dose. Heliocare can be purchased at [facility] or at [email or web address].      Melanoma ABCDEs  Melanoma is the most dangerous type of skin cancer, and is the leading cause of death from skin disease.  You are more likely to develop melanoma if you: Have light-colored skin, light-colored eyes, or red or blond hair Spend a lot of time in the sun Tan regularly, either outdoors or in a tanning bed Have had blistering sunburns, especially during childhood Have a close family member who has had a melanoma Have atypical moles or large birthmarks  Early detection of melanoma is key since treatment is typically straightforward and cure rates are extremely high if we catch it early.   The first sign of  melanoma is often a change in a mole or a new dark spot.  The ABCDE system is a way of remembering the signs of melanoma.  A for asymmetry:  The two halves do not match. B for border:  The edges of the growth are irregular. C for color:  A mixture of colors are present instead of an even brown color. D for diameter:  Melanomas are usually (but not always) greater than 69mm - the size of a pencil eraser. E for evolution:  The spot keeps changing in size, shape, and color.  Please check your skin once per month between visits. You can use a small mirror in front and a large mirror behind you to keep an eye on the back side or your body.   If you see any new or changing lesions before your next follow-up, please call to schedule a visit.  Please continue daily skin protection including broad spectrum sunscreen SPF 30+ to sun-exposed areas, reapplying every 2 hours as needed when you're outdoors.   Staying in the shade or wearing long sleeves, sun glasses (UVA+UVB protection) and wide brim hats (4-inch brim around the entire circumference of the hat) are also recommended for sun protection.    If You Need Anything After Your Visit  If you have any questions or concerns for your doctor, please call our main line at [phone] and press option 4 to reach your doctor's medical assistant. If no one answers, please leave a voicemail as directed and we will return your call as soon as possible. Messages left after 4  pm will be answered the following business day.   You may also send [place] a message via [place]. We typically respond to MyChart messages within 1-2 business days.  For prescription refills, please ask your pharmacy to contact our office. Our fax number is [phone].  If you have an urgent issue when the clinic is closed that cannot wait until the next business day, you can page your doctor at the number below.    Please note that while we do our best to be available for urgent issues  outside of office hours, we are not available 24/7.   If you have an urgent issue and are unable to reach [place], you may choose to seek medical care at your doctor's office, retail clinic, urgent care center, or emergency room.  If you have a medical emergency, please immediately call 911 or go to the emergency department.  Pager Numbers  - Dr. Nehemiah Massed: [phone]  - Dr. Laurence Ferrari: [phone]  - Dr. Nicole Kindred: [phone]  In the event of inclement weather, please call our main line at [phone] for an update on the status of any delays or closures.  Dermatology Medication Tips: Please keep the boxes that topical medications come in in order to help keep track of the instructions about where and how to use these. Pharmacies typically print the medication instructions only on the boxes and not directly on the medication tubes.   If your medication is too expensive, please contact our office at [phone] option 4 or send [place] a message through [place].   We are unable to tell what your co-pay for medications will be in advance as this is different depending on your insurance coverage. However, we may be able to find a substitute medication at lower cost or fill out paperwork to get insurance to cover a needed medication.   If a prior authorization is required to get your medication covered by your insurance company, please allow [place] 1-2 business days to complete this process.  Drug prices often vary depending on where the prescription is filled and some pharmacies may offer cheaper prices.  The website www.goodrx.com contains coupons for medications through different pharmacies. The prices here do not account for what the cost may be with help from insurance (it may be cheaper with your insurance), but the website can give you the price if you did not use any insurance.  - You can print the associated coupon and take it with your prescription to the pharmacy.  - You may also stop by our  office during regular business hours and pick up a GoodRx coupon card.  - If you need your prescription sent electronically to a different pharmacy, notify our office through [facility] or by phone at [phone] option 4.     Si Usted Necesita Algo Despus de Su Visita  Tambin puede enviarnos un mensaje a travs de Pharmacist, community. Por lo general respondemos a los mensajes de MyChart en el transcurso de 1 a 2 das hbiles.  Para renovar recetas, por favor pida a su farmacia que se ponga en contacto con nuestra oficina. Harland Dingwall de fax es [address] [phone].  Si tiene un asunto urgente cuando la clnica est cerrada y que no puede esperar hasta el siguiente da hbil, puede llamar/localizar a su doctor(a) al nmero que aparece a continuacin.   Por favor, tenga en cuenta que aunque hacemos todo lo posible para estar disponibles para asuntos urgentes fuera del horario de oficina, no estamos disponibles las 24  horas del Training and development officer, los 7 das de la [place].   Si tiene un problema urgente y no puede comunicarse con nosotros, puede optar por buscar atencin mdica  en el consultorio de su doctor(a), en una clnica privada, en un centro de atencin urgente o en una sala de emergencias.  Si tiene Engineering geologist, por favor llame inmediatamente al 911 o vaya a la sala de emergencias.  Nmeros de bper  - Dr. Nehemiah Massed: [phone]  - Dra. Moye: [phone]  - Dra. Nicole Kindred: [phone]  En caso de inclemencias del [place], por favor llame a Johnsie Kindred principal al [phone] para una actualizacin sobre el [employer] de cualquier retraso o cierre.  Consejos para la medicacin en dermatologa: Por favor, guarde las cajas en las que vienen los medicamentos de uso tpico para ayudarle a seguir las instrucciones sobre dnde y cmo usarlos. Las farmacias generalmente imprimen las instrucciones del medicamento slo en las cajas y no directamente en los tubos del [place].   Si su  medicamento es muy caro, por favor, pngase en contacto con Zigmund Daniel llamando al [phone] y presione la opcin 4 o envenos un mensaje a travs de Pharmacist, community.   No podemos decirle cul ser su copago por los medicamentos por adelantado ya que esto es diferente dependiendo de la cobertura de su seguro. Sin embargo, es posible que podamos encontrar un medicamento sustituto a Electrical engineer un formulario para que el seguro cubra el medicamento que se considera necesario.   Si se requiere una autorizacin previa para que su compaa de seguros [place] su medicamento, por favor permtanos de 1 a 2 das hbiles para completar este proceso.  Los precios de los medicamentos varan con frecuencia dependiendo del Environmental consultant de dnde se surte la receta y alguna farmacias pueden ofrecer precios ms baratos.  El sitio web www.goodrx.com tiene cupones para medicamentos de Airline pilot. Los precios aqu no tienen en cuenta lo que podra costar con la ayuda del seguro (puede ser ms barato con su seguro), pero el sitio web puede darle el precio si no utiliz Research scientist (physical sciences).  - Puede imprimir el cupn correspondiente y llevarlo con su receta a la farmacia.  - Tambin puede pasar por nuestra oficina durante el horario de atencin regular y Charity fundraiser una tarjeta de cupones de GoodRx.  - Si necesita que su receta se enve electrnicamente a una farmacia diferente, informe a nuestra oficina a travs de MyChart de [place] o por telfono llamando al [phone] y presione la opcin 4.

## 2021-07-20 ENCOUNTER — Other Ambulatory Visit: Payer: Self-pay

## 2021-07-20 MED ORDER — FLUTICASONE PROPIONATE 50 MCG/ACT NA SUSP
NASAL | 1 refills | Status: DC
  Filled 2021-07-20: qty 48, 90d supply, fill #0
  Filled 2021-11-04: qty 48, 90d supply, fill #1

## 2021-07-24 ENCOUNTER — Other Ambulatory Visit: Payer: Self-pay

## 2021-07-25 ENCOUNTER — Other Ambulatory Visit: Payer: Self-pay

## 2021-07-26 ENCOUNTER — Other Ambulatory Visit: Payer: Self-pay

## 2021-07-26 MED ORDER — AMLODIPINE BESYLATE 5 MG PO TABS
ORAL_TABLET | ORAL | 1 refills | Status: DC
  Filled 2021-07-26: qty 90, 90d supply, fill #0
  Filled 2021-10-20: qty 90, 90d supply, fill #1

## 2021-08-08 ENCOUNTER — Other Ambulatory Visit: Payer: Self-pay

## 2021-08-29 DIAGNOSIS — I10 Essential (primary) hypertension: Secondary | ICD-10-CM | POA: Diagnosis not present

## 2021-08-29 DIAGNOSIS — Z136 Encounter for screening for cardiovascular disorders: Secondary | ICD-10-CM | POA: Diagnosis not present

## 2021-09-05 DIAGNOSIS — Z0000 Encounter for general adult medical examination without abnormal findings: Secondary | ICD-10-CM | POA: Diagnosis not present

## 2021-09-05 DIAGNOSIS — E669 Obesity, unspecified: Secondary | ICD-10-CM | POA: Diagnosis not present

## 2021-09-05 DIAGNOSIS — I10 Essential (primary) hypertension: Secondary | ICD-10-CM | POA: Diagnosis not present

## 2021-09-05 DIAGNOSIS — N1831 Chronic kidney disease, stage 3a: Secondary | ICD-10-CM | POA: Diagnosis not present

## 2021-10-20 ENCOUNTER — Other Ambulatory Visit: Payer: Self-pay

## 2021-10-21 ENCOUNTER — Other Ambulatory Visit: Payer: Self-pay

## 2021-10-21 DIAGNOSIS — J019 Acute sinusitis, unspecified: Secondary | ICD-10-CM | POA: Diagnosis not present

## 2021-10-21 MED ORDER — PREDNISONE 10 MG PO TABS
ORAL_TABLET | ORAL | 0 refills | Status: DC
  Filled 2021-10-21: qty 5, 5d supply, fill #0

## 2021-10-21 MED ORDER — AZITHROMYCIN 250 MG PO TABS
ORAL_TABLET | ORAL | 0 refills | Status: DC
  Filled 2021-10-21: qty 6, 5d supply, fill #0

## 2021-10-21 MED ORDER — BENZONATATE 200 MG PO CAPS
ORAL_CAPSULE | ORAL | 0 refills | Status: DC
  Filled 2021-10-21: qty 21, 7d supply, fill #0

## 2021-11-04 ENCOUNTER — Other Ambulatory Visit: Payer: Self-pay

## 2021-11-04 MED ORDER — LOSARTAN POTASSIUM 50 MG PO TABS
50.0000 mg | ORAL_TABLET | Freq: Every day | ORAL | 1 refills | Status: DC
  Filled 2021-11-04: qty 90, 90d supply, fill #0
  Filled 2022-02-03: qty 90, 90d supply, fill #1

## 2021-11-06 ENCOUNTER — Other Ambulatory Visit: Payer: Self-pay

## 2021-11-07 ENCOUNTER — Other Ambulatory Visit: Payer: Self-pay

## 2022-01-23 ENCOUNTER — Other Ambulatory Visit: Payer: Self-pay

## 2022-01-23 MED ORDER — AMLODIPINE BESYLATE 5 MG PO TABS
ORAL_TABLET | ORAL | 1 refills | Status: DC
  Filled 2022-01-23: qty 90, 90d supply, fill #0
  Filled 2022-04-21: qty 90, 90d supply, fill #1

## 2022-01-25 ENCOUNTER — Other Ambulatory Visit: Payer: Self-pay

## 2022-01-26 ENCOUNTER — Other Ambulatory Visit: Payer: Self-pay

## 2022-01-26 DIAGNOSIS — H52203 Unspecified astigmatism, bilateral: Secondary | ICD-10-CM | POA: Diagnosis not present

## 2022-01-26 DIAGNOSIS — H5203 Hypermetropia, bilateral: Secondary | ICD-10-CM | POA: Diagnosis not present

## 2022-01-26 DIAGNOSIS — H524 Presbyopia: Secondary | ICD-10-CM | POA: Diagnosis not present

## 2022-01-26 DIAGNOSIS — H25813 Combined forms of age-related cataract, bilateral: Secondary | ICD-10-CM | POA: Diagnosis not present

## 2022-01-27 ENCOUNTER — Other Ambulatory Visit: Payer: Self-pay

## 2022-01-30 ENCOUNTER — Other Ambulatory Visit: Payer: Self-pay

## 2022-02-01 ENCOUNTER — Other Ambulatory Visit: Payer: Self-pay

## 2022-02-02 ENCOUNTER — Other Ambulatory Visit: Payer: Self-pay

## 2022-02-03 ENCOUNTER — Other Ambulatory Visit: Payer: Self-pay

## 2022-02-23 ENCOUNTER — Other Ambulatory Visit: Payer: Self-pay

## 2022-02-23 DIAGNOSIS — N403 Nodular prostate with lower urinary tract symptoms: Secondary | ICD-10-CM | POA: Diagnosis not present

## 2022-02-23 DIAGNOSIS — R972 Elevated prostate specific antigen [PSA]: Secondary | ICD-10-CM | POA: Diagnosis not present

## 2022-02-23 DIAGNOSIS — N401 Enlarged prostate with lower urinary tract symptoms: Secondary | ICD-10-CM | POA: Diagnosis not present

## 2022-02-23 DIAGNOSIS — R350 Frequency of micturition: Secondary | ICD-10-CM | POA: Diagnosis not present

## 2022-02-23 DIAGNOSIS — N5201 Erectile dysfunction due to arterial insufficiency: Secondary | ICD-10-CM | POA: Diagnosis not present

## 2022-02-23 MED ORDER — TADALAFIL 5 MG PO TABS
5.0000 mg | ORAL_TABLET | Freq: Every day | ORAL | 3 refills | Status: AC
  Filled 2022-02-23 – 2022-02-24 (×2): qty 90, 90d supply, fill #0
  Filled 2022-03-01: qty 30, 30d supply, fill #0
  Filled 2022-03-27: qty 30, 30d supply, fill #1
  Filled 2022-04-27 – 2022-05-01 (×2): qty 30, 30d supply, fill #2
  Filled 2022-05-16 – 2022-05-23 (×2): qty 30, 30d supply, fill #3
  Filled 2022-06-29: qty 30, 30d supply, fill #4
  Filled 2022-08-27: qty 30, 30d supply, fill #5
  Filled 2022-10-16: qty 30, 30d supply, fill #6

## 2022-02-24 ENCOUNTER — Other Ambulatory Visit: Payer: Self-pay

## 2022-02-28 ENCOUNTER — Other Ambulatory Visit: Payer: Self-pay

## 2022-03-01 ENCOUNTER — Other Ambulatory Visit: Payer: Self-pay

## 2022-03-01 DIAGNOSIS — N1831 Chronic kidney disease, stage 3a: Secondary | ICD-10-CM | POA: Diagnosis not present

## 2022-03-01 DIAGNOSIS — I10 Essential (primary) hypertension: Secondary | ICD-10-CM | POA: Diagnosis not present

## 2022-03-06 ENCOUNTER — Other Ambulatory Visit: Payer: Self-pay | Admitting: Urology

## 2022-03-06 DIAGNOSIS — N402 Nodular prostate without lower urinary tract symptoms: Secondary | ICD-10-CM

## 2022-03-08 ENCOUNTER — Ambulatory Visit
Admission: RE | Admit: 2022-03-08 | Discharge: 2022-03-08 | Disposition: A | Discharge status: Home / Self Care | Payer: 59 | Source: Ambulatory Visit | Attending: Urology | Admitting: Urology

## 2022-03-08 DIAGNOSIS — N402 Nodular prostate without lower urinary tract symptoms: Secondary | ICD-10-CM | POA: Diagnosis not present

## 2022-03-08 DIAGNOSIS — Z8719 Personal history of other diseases of the digestive system: Secondary | ICD-10-CM | POA: Diagnosis not present

## 2022-03-08 DIAGNOSIS — R350 Frequency of micturition: Secondary | ICD-10-CM | POA: Diagnosis not present

## 2022-03-08 DIAGNOSIS — R590 Localized enlarged lymph nodes: Secondary | ICD-10-CM | POA: Diagnosis not present

## 2022-03-08 DIAGNOSIS — N400 Enlarged prostate without lower urinary tract symptoms: Secondary | ICD-10-CM | POA: Diagnosis not present

## 2022-03-08 MED ORDER — GADOBUTROL 1 MMOL/ML IV SOLN
9.0000 mL | Freq: Once | INTRAVENOUS | Status: AC | PRN
  Administered 2022-03-08: 9 mL via INTRAVENOUS

## 2022-03-17 DIAGNOSIS — N401 Enlarged prostate with lower urinary tract symptoms: Secondary | ICD-10-CM | POA: Diagnosis not present

## 2022-03-17 DIAGNOSIS — N210 Calculus in bladder: Secondary | ICD-10-CM | POA: Diagnosis not present

## 2022-03-17 DIAGNOSIS — N403 Nodular prostate with lower urinary tract symptoms: Secondary | ICD-10-CM | POA: Diagnosis not present

## 2022-03-23 ENCOUNTER — Other Ambulatory Visit: Payer: Self-pay

## 2022-03-23 MED ORDER — FLUTICASONE PROPIONATE 50 MCG/ACT NA SUSP
NASAL | 1 refills | Status: DC
  Filled 2022-03-23: qty 48, 90d supply, fill #0
  Filled 2022-07-10: qty 48, 90d supply, fill #1

## 2022-03-26 DIAGNOSIS — N210 Calculus in bladder: Secondary | ICD-10-CM

## 2022-03-26 HISTORY — DX: Calculus in bladder: N21.0

## 2022-03-27 ENCOUNTER — Other Ambulatory Visit: Payer: Self-pay

## 2022-04-06 DIAGNOSIS — N401 Enlarged prostate with lower urinary tract symptoms: Secondary | ICD-10-CM | POA: Diagnosis not present

## 2022-04-06 DIAGNOSIS — N210 Calculus in bladder: Secondary | ICD-10-CM | POA: Diagnosis not present

## 2022-04-11 DIAGNOSIS — Z136 Encounter for screening for cardiovascular disorders: Secondary | ICD-10-CM | POA: Diagnosis not present

## 2022-04-11 DIAGNOSIS — R972 Elevated prostate specific antigen [PSA]: Secondary | ICD-10-CM | POA: Diagnosis not present

## 2022-04-11 DIAGNOSIS — N210 Calculus in bladder: Secondary | ICD-10-CM | POA: Diagnosis not present

## 2022-04-11 DIAGNOSIS — E6609 Other obesity due to excess calories: Secondary | ICD-10-CM | POA: Diagnosis not present

## 2022-04-11 DIAGNOSIS — Z6831 Body mass index (BMI) 31.0-31.9, adult: Secondary | ICD-10-CM | POA: Diagnosis not present

## 2022-04-11 DIAGNOSIS — I10 Essential (primary) hypertension: Secondary | ICD-10-CM | POA: Diagnosis not present

## 2022-04-11 DIAGNOSIS — N401 Enlarged prostate with lower urinary tract symptoms: Secondary | ICD-10-CM | POA: Diagnosis not present

## 2022-04-11 DIAGNOSIS — N403 Nodular prostate with lower urinary tract symptoms: Secondary | ICD-10-CM | POA: Diagnosis not present

## 2022-04-13 ENCOUNTER — Other Ambulatory Visit: Payer: Self-pay | Admitting: Urology

## 2022-04-21 ENCOUNTER — Other Ambulatory Visit: Payer: Self-pay

## 2022-04-25 ENCOUNTER — Encounter
Admission: RE | Admit: 2022-04-25 | Discharge: 2022-04-25 | Disposition: A | Discharge status: Home / Self Care | Payer: 59 | Source: Ambulatory Visit | Attending: Urology | Admitting: Urology

## 2022-04-25 DIAGNOSIS — Q211 Atrial septal defect, unspecified: Secondary | ICD-10-CM | POA: Insufficient documentation

## 2022-04-25 DIAGNOSIS — Z01818 Encounter for other preprocedural examination: Secondary | ICD-10-CM | POA: Insufficient documentation

## 2022-04-25 DIAGNOSIS — I10 Essential (primary) hypertension: Secondary | ICD-10-CM | POA: Diagnosis not present

## 2022-04-25 DIAGNOSIS — Z01812 Encounter for preprocedural laboratory examination: Secondary | ICD-10-CM

## 2022-04-25 HISTORY — DX: Benign prostatic hyperplasia without lower urinary tract symptoms: N40.0

## 2022-04-25 HISTORY — DX: Gout, unspecified: M10.9

## 2022-04-25 HISTORY — DX: Obesity, unspecified: E66.9

## 2022-04-25 LAB — CBC
HCT: 37.6 % — ABNORMAL LOW (ref 39.0–52.0)
Hemoglobin: 12.4 g/dL — ABNORMAL LOW (ref 13.0–17.0)
MCH: 29.7 pg (ref 26.0–34.0)
MCHC: 33 g/dL (ref 30.0–36.0)
MCV: 90.2 fL (ref 80.0–100.0)
Platelets: 215 10*3/uL (ref 150–400)
RBC: 4.17 MIL/uL — ABNORMAL LOW (ref 4.22–5.81)
RDW: 12.6 % (ref 11.5–15.5)
WBC: 6.3 10*3/uL (ref 4.0–10.5)
nRBC: 0 % (ref 0.0–0.2)

## 2022-04-25 NOTE — Patient Instructions (Addendum)
Your procedure is scheduled on: Thursday, November 9 Report to the Registration Desk on the 1st floor of the [facility]. To find out your arrival time, please call [phone] between 1PM - 3PM on: Wednesday, November 8 If your arrival time is 6:00 am, do not arrive prior to that time as the [facility] entrance doors do not open until 6:00 am.  REMEMBER: Instructions that are not followed completely may result in serious medical risk, up to and including death; or upon the discretion of your surgeon and anesthesiologist your surgery may need to be rescheduled.  Do not eat food after midnight the night before surgery.  No gum chewing, lozengers or hard candies.  You may however, drink CLEAR liquids up to 2 hours before you are scheduled to arrive for your surgery. Do not drink anything within 2 hours of your scheduled arrival time.  Clear liquids include: - water  - apple juice without pulp - gatorade (not RED colors) - black coffee or tea (Do NOT add milk or creamers to the coffee or tea) Do NOT drink anything that is not on this list.  TAKE THESE MEDICATIONS THE MORNING OF SURGERY WITH A SIP OF WATER:  Amlodipine  One week prior to surgery: starting November 2 Stop aspirin and Anti-inflammatories (NSAIDS) such as Advil, Aleve, Ibuprofen, Motrin, Naproxen, Naprosyn and Aspirin based products such as Excedrin, Goodys Powder, BC Powder. Stop ANY OVER THE COUNTER supplements until after surgery. Stop vitamin D. You may however, continue to take Tylenol if needed for pain up until the day of surgery.  No Alcohol for 24 hours before or after surgery.  No Smoking including e-cigarettes for 24 hours prior to surgery.  No chewable tobacco products for at least 6 hours prior to surgery.  No nicotine patches on the day of surgery.  On the morning of surgery brush your teeth with toothpaste and water, you may rinse your mouth with mouthwash if you wish. Do not swallow any toothpaste  or mouthwash.  Do not wear jewelry.  Do not wear lotions, powders, or perfumes.   Do not shave body from the neck down 48 hours prior to surgery just in case you cut yourself which could leave a site for infection.   Contact lenses, hearing aids and dentures may not be worn into surgery.  Do not bring valuables to the hospital. [facility] is not responsible for any missing/lost belongings or valuables.   Fleets enema prior to coming to the hospital on the day of surgery. May repeat until clear.  Notify your doctor if there is any change in your medical condition (cold, fever, infection).  Wear comfortable clothing (specific to your surgery type) to the hospital.  After surgery, you can help prevent lung complications by doing breathing exercises.  Take deep breaths and cough every 1-2 hours. Your doctor may order a device called an Incentive Spirometer to help you take deep breaths.  If you are being discharged the day of surgery, you will not be allowed to drive home. You will need a responsible adult (18 years or older) to drive you home and stay with you that night.   If you are taking public transportation, you will need to have a responsible adult (18 years or older) with you. Please confirm with your physician that it is acceptable to use public transportation.   Please call the [place] [facility] at [phone] if you have any questions about these instructions.  Surgery Visitation Policy:  Patients undergoing a surgery or procedure may have two family members or support persons with them as long as the person is not COVID-19 positive or experiencing its symptoms.

## 2022-04-27 ENCOUNTER — Other Ambulatory Visit: Payer: Self-pay

## 2022-04-28 NOTE — H&P (Unsigned)
[name], [name] MEDICAL RECORD NO: [id number] ACCOUNT NO: [id number] DATE OF BIRTH: 1956-03-14 FACILITY: [facility] LOCATION: [facility] PHYSICIAN: Otelia Limes. Yves Dill, MD  History and Physical   DATE OF ADMISSION: 05/04/2022  CHIEF COMPLAINT:  Elevated PSA and bladder stone.  HISTORY OF PRESENT ILLNESS:  [name] is a 66 year old Caucasian male with lower urinary tract symptoms and bladder stone.  He also has a palpable right sided prostate nodule.  PSA level was 1.2 ng/mL. MRI scan was nondiagnostic for prostate cancer  because he had the presence of bilateral metal hip implants, which could not allow the radiologist to properly evaluate the prostate.  He did have a bladder stone present.  He comes in now for photovaporization of the prostate with GreenLight laser,  litholapaxy of bladder stone and an ultrasound-guided needle biopsy of the prostate.  Uroflow study 04/06/2022 indicated maximum flow rate of 11 mL per second with average flow rate of 5.3 mL per second and a postvoid residual of 162 mL with a voided volume of 261 mL. Prostate MRI scan revealed a prostate volume of 22 mL, Exosome  IntelliScore was 61.16, which was above the cutoff for higher risk of high-grade prostate cancer and KUB on 03/17/2022 indicated a 12 x 12 mm bladder stone.  ALLERGIES:  No drug allergies.  CURRENT MEDICATIONS:  Included losartan, amlodipine, fluticasone, vitamin D3, aspirin, Centrum silver, sildenafil and tadalafil.  PAST SURGICAL HISTORY: 1.  Right hip replacement 2009. 2.  Left hip replacement 2010. 3.  Bilateral inguinal herniorrhaphies 2016.  PAST AND CURRENT MEDICAL CONDITIONS:  1.  Hypertension. 2.  Erectile dysfunction. 3.  Benign atrial septal defect. 4.  Seasonal allergies.  REVIEW OF SYSTEMS:  The patient denies chest pain, shortness of breath, diabetes, heart disease, other than septal defects and stroke.  PHYSICAL EXAMINATION:   VITAL SIGNS:  Weight 213 pounds, height 5 feet  8 inches, BMI 32. GENERAL:  Well-nourished white male in no acute distress. HEENT:  Sclerae were clear.  Pupils are equally round, reactive to light and accommodation.  Extraocular movements intact. NECK:  No palpable cervical masses or tenderness. LYMPHATIC:  No palpable cervical or inguinal adenopathy. PULMONARY:  Lungs clear to auscultation. CARDIOVASCULAR:  Regular rhythm and rate without audible murmurs. ABDOMEN:  Soft, nontender abdomen. GENITOURINARY:  Circumcised.  Testes smooth, nontender.  Rectal exam 30 gram prostate with a hard right nodule. NEUROMUSCULAR:  Alert and oriented x 3.  IMPRESSION:  1.  Right prostatic nodule. 2.  BPH with bladder outlet obstruction. 3.  Erectile dysfunction. 4.  Bladder stone.  PLAN: 1.  Ultrasound-guided needle biopsy of prostate. 2.  Photovaporization of prostate with GreenLight laser. 3.  Laser lithotripsy of bladder stone with the holmium laser.    MUK D: 04/28/2022 9:04:33 am T: 04/28/2022 9:26:00 am  JOB: [id number]/ [id number]

## 2022-05-01 ENCOUNTER — Other Ambulatory Visit: Payer: Self-pay

## 2022-05-02 ENCOUNTER — Other Ambulatory Visit: Payer: Self-pay | Admitting: Urology

## 2022-05-02 ENCOUNTER — Other Ambulatory Visit: Payer: Self-pay

## 2022-05-02 DIAGNOSIS — R972 Elevated prostate specific antigen [PSA]: Secondary | ICD-10-CM

## 2022-05-02 MED ORDER — LOSARTAN POTASSIUM 50 MG PO TABS
50.0000 mg | ORAL_TABLET | Freq: Every day | ORAL | 1 refills | Status: DC
  Filled 2022-05-02: qty 90, 90d supply, fill #0
  Filled 2022-08-03: qty 90, 90d supply, fill #1

## 2022-05-03 MED ORDER — CHLORHEXIDINE GLUCONATE 0.12 % MT SOLN: 15.0000 mL | Freq: Once | OROMUCOSAL | Status: AC

## 2022-05-03 MED ORDER — GENTAMICIN IN SALINE 1.6-0.9 MG/ML-% IV SOLN
80.0000 mg | INTRAVENOUS | Status: AC
  Administered 2022-05-04: 80 mg via INTRAVENOUS
  Filled 2022-05-03: qty 50

## 2022-05-03 MED ORDER — GENTAMICIN SULFATE 40 MG/ML IJ SOLN
80.0000 mg | Freq: Once | INTRAVENOUS | Status: DC
  Filled 2022-05-03: qty 2

## 2022-05-03 MED ORDER — CEFAZOLIN SODIUM-DEXTROSE 1-4 GM/50ML-% IV SOLN
1.0000 g | Freq: Once | INTRAVENOUS | Status: AC
  Administered 2022-05-04: 1 g via INTRAVENOUS

## 2022-05-03 MED ORDER — ORAL CARE MOUTH RINSE: 15.0000 mL | Freq: Once | OROMUCOSAL | Status: AC

## 2022-05-03 MED ORDER — LACTATED RINGERS IV SOLN: INTRAVENOUS | Status: DC

## 2022-05-03 MED ORDER — FLEET ENEMA 7-19 GM/118ML RE ENEM: 1.0000 | ENEMA | Freq: Once | RECTAL | Status: DC

## 2022-05-03 MED ORDER — FAMOTIDINE 20 MG PO TABS: 20.0000 mg | ORAL_TABLET | Freq: Once | ORAL | Status: AC

## 2022-05-04 ENCOUNTER — Encounter: Payer: Self-pay | Admitting: Urology

## 2022-05-04 ENCOUNTER — Other Ambulatory Visit: Payer: Self-pay

## 2022-05-04 ENCOUNTER — Ambulatory Visit
Admission: RE | Admit: 2022-05-04 | Discharge: 2022-05-04 | Disposition: A | Discharge status: Home / Self Care | Payer: 59 | Source: Ambulatory Visit | Attending: Urology | Admitting: Urology

## 2022-05-04 ENCOUNTER — Ambulatory Visit: Payer: 59 | Admitting: Anesthesiology

## 2022-05-04 ENCOUNTER — Other Ambulatory Visit: Payer: 59

## 2022-05-04 ENCOUNTER — Encounter
Admission: RE | Disposition: A | Discharge status: Home / Self Care | Payer: Self-pay | Source: Ambulatory Visit | Attending: Urology

## 2022-05-04 DIAGNOSIS — N402 Nodular prostate without lower urinary tract symptoms: Secondary | ICD-10-CM | POA: Diagnosis not present

## 2022-05-04 DIAGNOSIS — E669 Obesity, unspecified: Secondary | ICD-10-CM | POA: Diagnosis not present

## 2022-05-04 DIAGNOSIS — Z6830 Body mass index (BMI) 30.0-30.9, adult: Secondary | ICD-10-CM | POA: Insufficient documentation

## 2022-05-04 DIAGNOSIS — I10 Essential (primary) hypertension: Secondary | ICD-10-CM | POA: Insufficient documentation

## 2022-05-04 DIAGNOSIS — N403 Nodular prostate with lower urinary tract symptoms: Secondary | ICD-10-CM | POA: Insufficient documentation

## 2022-05-04 DIAGNOSIS — N320 Bladder-neck obstruction: Secondary | ICD-10-CM | POA: Diagnosis not present

## 2022-05-04 DIAGNOSIS — Z87891 Personal history of nicotine dependence: Secondary | ICD-10-CM | POA: Insufficient documentation

## 2022-05-04 DIAGNOSIS — R972 Elevated prostate specific antigen [PSA]: Secondary | ICD-10-CM

## 2022-05-04 DIAGNOSIS — Z01812 Encounter for preprocedural laboratory examination: Secondary | ICD-10-CM

## 2022-05-04 DIAGNOSIS — N210 Calculus in bladder: Secondary | ICD-10-CM | POA: Diagnosis not present

## 2022-05-04 DIAGNOSIS — N401 Enlarged prostate with lower urinary tract symptoms: Secondary | ICD-10-CM | POA: Diagnosis not present

## 2022-05-04 HISTORY — PX: CYSTOSCOPY WITH LITHOLAPAXY: SHX1425

## 2022-05-04 HISTORY — PX: GREEN LIGHT LASER TURP (TRANSURETHRAL RESECTION OF PROSTATE: SHX6260

## 2022-05-04 HISTORY — PX: PROSTATE BIOPSY: SHX241

## 2022-05-04 SURGERY — BIOPSY, PROSTATE
Anesthesia: General

## 2022-05-04 MED ORDER — ROCURONIUM BROMIDE 10 MG/ML (PF) SYRINGE
PREFILLED_SYRINGE | INTRAVENOUS | Status: AC
  Filled 2022-05-04: qty 10

## 2022-05-04 MED ORDER — CEFAZOLIN SODIUM-DEXTROSE 1-4 GM/50ML-% IV SOLN
INTRAVENOUS | Status: AC
  Filled 2022-05-04: qty 50

## 2022-05-04 MED ORDER — ROCURONIUM BROMIDE 100 MG/10ML IV SOLN
INTRAVENOUS | Status: DC | PRN
  Administered 2022-05-04: 60 mg via INTRAVENOUS
  Administered 2022-05-04: 10 mg via INTRAVENOUS

## 2022-05-04 MED ORDER — PROPOFOL 10 MG/ML IV BOLUS
INTRAVENOUS | Status: DC | PRN
  Administered 2022-05-04: 100 mg via INTRAVENOUS

## 2022-05-04 MED ORDER — DEXMEDETOMIDINE HCL IN NACL 200 MCG/50ML IV SOLN
INTRAVENOUS | Status: DC | PRN
  Administered 2022-05-04: 8 ug via INTRAVENOUS
  Administered 2022-05-04: 4 ug via INTRAVENOUS

## 2022-05-04 MED ORDER — FENTANYL CITRATE (PF) 100 MCG/2ML IJ SOLN
INTRAMUSCULAR | Status: AC
  Filled 2022-05-04: qty 2

## 2022-05-04 MED ORDER — ACETAMINOPHEN 10 MG/ML IV SOLN
INTRAVENOUS | Status: AC
  Filled 2022-05-04: qty 100

## 2022-05-04 MED ORDER — PROPOFOL 10 MG/ML IV BOLUS
INTRAVENOUS | Status: AC
  Filled 2022-05-04: qty 20

## 2022-05-04 MED ORDER — EPHEDRINE 5 MG/ML INJ
INTRAVENOUS | Status: AC
  Filled 2022-05-04: qty 5

## 2022-05-04 MED ORDER — ONDANSETRON HCL 4 MG/2ML IJ SOLN
INTRAMUSCULAR | Status: AC
  Filled 2022-05-04: qty 2

## 2022-05-04 MED ORDER — MIDAZOLAM HCL 2 MG/2ML IJ SOLN
INTRAMUSCULAR | Status: DC | PRN
  Administered 2022-05-04: 2 mg via INTRAVENOUS

## 2022-05-04 MED ORDER — DEXAMETHASONE SODIUM PHOSPHATE 10 MG/ML IJ SOLN
INTRAMUSCULAR | Status: DC | PRN
  Administered 2022-05-04: 10 mg via INTRAVENOUS

## 2022-05-04 MED ORDER — ACETAMINOPHEN 10 MG/ML IV SOLN
INTRAVENOUS | Status: DC | PRN
  Administered 2022-05-04: 1000 mg via INTRAVENOUS

## 2022-05-04 MED ORDER — DOCUSATE SODIUM 100 MG PO CAPS
200.0000 mg | ORAL_CAPSULE | Freq: Two times a day (BID) | ORAL | 3 refills | Status: DC
  Filled 2022-05-04: qty 120, 30d supply, fill #0

## 2022-05-04 MED ORDER — MIDAZOLAM HCL 2 MG/2ML IJ SOLN
INTRAMUSCULAR | Status: AC
  Filled 2022-05-04: qty 2

## 2022-05-04 MED ORDER — LIDOCAINE HCL (CARDIAC) PF 100 MG/5ML IV SOSY
PREFILLED_SYRINGE | INTRAVENOUS | Status: DC | PRN
  Administered 2022-05-04: 80 mg via INTRAVENOUS

## 2022-05-04 MED ORDER — LIDOCAINE HCL URETHRAL/MUCOSAL 2 % EX GEL
CUTANEOUS | Status: DC | PRN
  Administered 2022-05-04: 1 via URETHRAL

## 2022-05-04 MED ORDER — SEVOFLURANE IN SOLN
RESPIRATORY_TRACT | Status: AC
  Filled 2022-05-04: qty 250

## 2022-05-04 MED ORDER — ONDANSETRON HCL 4 MG/2ML IJ SOLN
INTRAMUSCULAR | Status: DC | PRN
  Administered 2022-05-04: 4 mg via INTRAVENOUS

## 2022-05-04 MED ORDER — SODIUM CHLORIDE 0.9 % IR SOLN
Status: DC | PRN
  Administered 2022-05-04: 12000 mL

## 2022-05-04 MED ORDER — FENTANYL CITRATE (PF) 100 MCG/2ML IJ SOLN
INTRAMUSCULAR | Status: DC | PRN
  Administered 2022-05-04 (×2): 50 ug via INTRAVENOUS

## 2022-05-04 MED ORDER — SUGAMMADEX SODIUM 200 MG/2ML IV SOLN
INTRAVENOUS | Status: DC | PRN
  Administered 2022-05-04: 200 mg via INTRAVENOUS

## 2022-05-04 MED ORDER — LIDOCAINE HCL (PF) 2 % IJ SOLN
INTRAMUSCULAR | Status: AC
  Filled 2022-05-04: qty 5

## 2022-05-04 MED ORDER — URIBEL 118 MG PO CAPS
1.0000 | ORAL_CAPSULE | Freq: Four times a day (QID) | ORAL | 3 refills | Status: DC | PRN
  Filled 2022-05-04: qty 11, 3d supply, fill #0

## 2022-05-04 MED ORDER — OXYCODONE HCL 5 MG PO TABS: 5.0000 mg | ORAL_TABLET | Freq: Once | ORAL | Status: DC | PRN

## 2022-05-04 MED ORDER — LIDOCAINE HCL URETHRAL/MUCOSAL 2 % EX GEL
CUTANEOUS | Status: AC
  Filled 2022-05-04: qty 10

## 2022-05-04 MED ORDER — OXYCODONE HCL 5 MG/5ML PO SOLN: 5.0000 mg | Freq: Once | ORAL | Status: DC | PRN

## 2022-05-04 MED ORDER — EPHEDRINE SULFATE (PRESSORS) 50 MG/ML IJ SOLN
INTRAMUSCULAR | Status: DC | PRN
  Administered 2022-05-04: 10 mg via INTRAVENOUS
  Administered 2022-05-04: 5 mg via INTRAVENOUS
  Administered 2022-05-04: 10 mg via INTRAVENOUS

## 2022-05-04 MED ORDER — LEVOFLOXACIN 500 MG PO TABS
500.0000 mg | ORAL_TABLET | Freq: Every day | ORAL | 0 refills | Status: DC
  Filled 2022-05-04: qty 7, 7d supply, fill #0

## 2022-05-04 MED ORDER — DEXAMETHASONE SODIUM PHOSPHATE 10 MG/ML IJ SOLN
INTRAMUSCULAR | Status: AC
  Filled 2022-05-04: qty 1

## 2022-05-04 MED ORDER — CHLORHEXIDINE GLUCONATE 0.12 % MT SOLN
OROMUCOSAL | Status: AC
  Administered 2022-05-04: 15 mL via OROMUCOSAL
  Filled 2022-05-04: qty 15

## 2022-05-04 MED ORDER — FAMOTIDINE 20 MG PO TABS
ORAL_TABLET | ORAL | Status: AC
  Administered 2022-05-04: 20 mg via ORAL
  Filled 2022-05-04: qty 1

## 2022-05-04 SURGICAL SUPPLY — 47 items
ADAPTER IRRIG TUBE 2 SPIKE SOL (ADAPTER) ×2 IMPLANT
ADPR TBG 2 SPK PMP STRL ASCP (ADAPTER) ×2
BAG DRAIN SIEMENS DORNER NS (MISCELLANEOUS) ×1 IMPLANT
BAG DRN NS LF (MISCELLANEOUS) ×1
BAG DRN RND TRDRP ANRFLXCHMBR (UROLOGICAL SUPPLIES) ×1
BAG URINE DRAIN 2000ML AR STRL (UROLOGICAL SUPPLIES) ×1 IMPLANT
CATH FOLEY 2WAY  5CC 20FR SIL (CATHETERS)
CATH FOLEY 2WAY 5CC 20FR SIL (CATHETERS) IMPLANT
CNTNR SPEC 2.5X3XGRAD LEK (MISCELLANEOUS)
CONT SPEC 4OZ STER OR WHT (MISCELLANEOUS)
CONT SPEC 4OZ STRL OR WHT (MISCELLANEOUS)
CONTAINER SPEC 2.5X3XGRAD LEK (MISCELLANEOUS) IMPLANT
COVER MAYO STAND REUSABLE (DRAPES) ×1 IMPLANT
CUP MEDICINE 2OZ PLAST GRAD ST (MISCELLANEOUS) ×1 IMPLANT
CYSTOSCOPE CON FLOW (MISCELLANEOUS) ×1 IMPLANT
FEE RENTAL LASER GREENLIGHT (Laser) ×1 IMPLANT
FIBER LASER FLEXIVA 550 (UROLOGICAL SUPPLIES) IMPLANT
FIBER LASER MOSES 200 DFL (Laser) IMPLANT
GAUZE 4X4 16PLY ~~LOC~~+RFID DBL (SPONGE) ×2 IMPLANT
GLOVE BIO SURGEON STRL SZ7.5 (GLOVE) ×1 IMPLANT
GLOVE BIOGEL M STRL SZ7.5 (GLOVE) ×1 IMPLANT
GOWN STRL REUS W/ TWL LRG LVL3 (GOWN DISPOSABLE) ×1 IMPLANT
GOWN STRL REUS W/ TWL LRG LVL4 (GOWN DISPOSABLE) ×1 IMPLANT
GOWN STRL REUS W/ TWL XL LVL3 (GOWN DISPOSABLE) ×1 IMPLANT
GOWN STRL REUS W/TWL LRG LVL3 (GOWN DISPOSABLE) ×1
GOWN STRL REUS W/TWL LRG LVL4 (GOWN DISPOSABLE) ×1
GOWN STRL REUS W/TWL XL LVL3 (GOWN DISPOSABLE) ×1
INST BIOPSY MAXCORE 18GX25 (NEEDLE) ×1 IMPLANT
IV NS 1000ML (IV SOLUTION) ×1
IV NS 1000ML BAXH (IV SOLUTION) ×1 IMPLANT
IV NS IRRIG 3000ML ARTHROMATIC (IV SOLUTION) ×4 IMPLANT
IV SET PRIMARY 15D 139IN B9900 (IV SETS) ×1 IMPLANT
KIT TURNOVER CYSTO (KITS) ×1 IMPLANT
LASER FIBER /GREENLIGHT LASER (Laser) ×2 IMPLANT
LASER GREENLIGHT RENTAL P/PROC (Laser) ×3 IMPLANT
MANIFOLD NEPTUNE II (INSTRUMENTS) ×1 IMPLANT
PACK CYSTO AR (MISCELLANEOUS) ×1 IMPLANT
SET IRRIG Y TYPE TUR BLADDER L (SET/KITS/TRAYS/PACK) ×1 IMPLANT
STRAP SAFETY 5IN WIDE (MISCELLANEOUS) ×1 IMPLANT
SURGILUBE 2OZ TUBE FLIPTOP (MISCELLANEOUS) ×1 IMPLANT
SYR TOOMEY IRRIG 70ML (MISCELLANEOUS) ×1
SYRINGE TOOMEY IRRIG 70ML (MISCELLANEOUS) ×1 IMPLANT
TOWEL OR 17X26 4PK STRL BLUE (TOWEL DISPOSABLE) ×1 IMPLANT
TRAP FLUID SMOKE EVACUATOR (MISCELLANEOUS) ×1 IMPLANT
WATER STERILE IRR 1000ML POUR (IV SOLUTION) ×1 IMPLANT
WATER STERILE IRR 3000ML UROMA (IV SOLUTION) ×1 IMPLANT
WATER STERILE IRR 500ML POUR (IV SOLUTION) ×1 IMPLANT

## 2022-05-04 NOTE — Anesthesia Procedure Notes (Signed)
Procedure Name: Intubation Date/Time: 05/04/2022 10:17 AM  Performed by: Jonna Clark, CRNAPre-anesthesia Checklist: Patient identified, Patient being monitored, Timeout performed, Emergency Drugs available and Suction available Patient Re-evaluated:Patient Re-evaluated prior to induction Oxygen Delivery Method: Circle system utilized Preoxygenation: Pre-oxygenation with 100% oxygen Induction Type: IV induction Ventilation: Mask ventilation without difficulty Laryngoscope Size: 3 and McGraph Grade View: Grade I Tube type: Oral Tube size: 7.0 mm Number of attempts: 1 Airway Equipment and Method: Stylet Placement Confirmation: ETT inserted through vocal cords under direct vision, positive ETCO2 and breath sounds checked- equal and bilateral Secured at: 21 cm Tube secured with: Tape Dental Injury: Teeth and Oropharynx as per pre-operative assessment

## 2022-05-04 NOTE — H&P (Signed)
Date of Initial H&P: 04/28/22  History reviewed, patient examined, no change in status, stable for surgery.

## 2022-05-04 NOTE — Discharge Instructions (Addendum)
[name] Laser Prostate Treatment, Care After The following information offers guidance on how to care for yourself after your procedure. Your health care provider may also give you more specific instructions. If you have problems or questions, contact your health care provider. What can I expect after the procedure? After the procedure, it is common to have these symptoms for a few days: Swelling and discomfort around your urethra. Blood in your urine. A burning feeling when you urinate after the urinary catheter is removed. You will feel this especially at the end of urination. This feeling usually passes within 3-5 days. For the first few weeks after the procedure, you may still feel: A sudden need to urinate (urgency). A need to urinate often. Follow these instructions at home: Medicines Take over-the-counter and prescription medicines only as told by your health care provider. These medicines may include stool softeners. If you were prescribed an antibiotic medicine, take it as told by your health care provider. Do not stop taking the antibiotic even if you start to feel better. Bathing Do not take baths, swim, or use a hot tub until your health care provider approves. Ask your health care provider if you may take showers. You may only be allowed to take sponge baths. Activity  Rest as told by your health care provider. Do not drive or operate machinery until your health care provider says that it is safe. Do not ride in a car for long periods of time, or as told by your health care provider. Do not do strenuous exercises for 1 week or as told by your health care provider. Strenuous exercises are exercises that require a lot of effort. Do not lift anything that is heavier than 10 lb (4.5 kg), or the limit that you are told, until your health care provider says that it is safe. Avoid sex for 4-6 weeks, or as told by your health care provider. Return to your normal activities as told by  your health care provider. Ask your health care provider what activities are safe for you. Preventing constipation You may need to take these actions to prevent or treat constipation: Drink enough fluid to keep your urine pale yellow. Take over-the-counter or prescription medicines. Eat foods that are high in fiber, such as beans, whole grains, and fresh fruits and vegetables. Limit foods that are high in fat and processed sugars, such as fried or sweet foods. General instructions  Do not strain when you have a bowel movement. Straining may lead to bleeding from the prostate. This may cause blood clots and trouble urinating. Do not use any products that contain nicotine or tobacco. These products include cigarettes, chewing tobacco, and vaping devices, such as e-cigarettes. If you need help quitting, ask your health care provider. If you have a urinary catheter, care for it as told by your health care provider. Keep all follow-up visits. This is important. Contact a health care provider if: You have signs of infection, such as: Fever or chills. Swelling around your urethra that is getting worse. Urine that smells very bad. Struggling to urinate or pain or burning when you urinate. You have trouble having a bowel movement. You have blood in your urine for more than 2 days after the procedure. You have trouble having or keeping an erection. No semen comes out during orgasm (dry ejaculation). You have a urinary catheter still in place and you have: Spasms or pain. Problems with the catheter or your catheter is blocked. Get help right away  if: You cannot urinate after your catheter is removed. Your urine is dark red or has blood clots in it. You have blood in your stool. You have severe pain that does not get better with medicine. You develop swelling or pain in your leg. You develop chest pains or shortness of breath. These symptoms may be an emergency. Get help right away. Call  911. Do not wait to see if the symptoms will go away. Do not drive yourself to the hospital. Summary After the procedure, it is common to have swelling and discomfort around your urethra and blood in your urine for a few days. Some men may have problems urinating after this procedure. These problems should go away after a few days. If you have pain or burning while urinating, contact your health care provider. If you have a catheter after this procedure, care for it as told by your health care provider. If you have severe pain, dark red urine, or urine with blood clots, get medical help right away. This information is not intended to replace advice given to you by your health care provider. Make sure you discuss any questions you have with your health care provider. Document Revised: 03/04/2021 Document Reviewed: 03/04/2021 Elsevier Patient Education  [place], Adult An indwelling urinary catheter is a thin tube that is put into your bladder. The tube helps to drain pee (urine) out of your body. The tube goes in through your urethra. Your urethra is where pee comes out of your body. Your pee will come out through the catheter, then it will go into a bag (drainage bag). Take good care of your catheter so it will work well. What are the risks? Germs may get into your bladder and cause an infection. The tube can become blocked. Tissue near the catheter may become irritated and may bleed. How to wear your catheter and drainage bag Supplies needed Sticky tape (adhesive tape) or a leg strap. Alcohol wipe or soap and water (if you use tape). A clean towel (if you use tape). Large overnight bag. Smaller bag (leg bag). Wearing your catheter Attach your catheter to your leg with tape or a leg strap. Make sure the catheter is not pulled tight. If a leg strap gets wet, take it off and put on a dry strap. If you use tape to hold the bag on your leg: Use  an alcohol wipe or soap and water to wash your skin where the tape made it sticky before. Use a clean towel to pat-dry that skin. Use new tape to make the bag stay on your leg. Wearing your bags You should have been given a large overnight bag. You may wear the overnight bag in the day or night. Always have the overnight bag lower than your bladder.  Do not let the bag touch the floor. Before you go to sleep, put a clean plastic bag in a wastebasket. Then, hang the overnight bag inside the wastebasket. You should also have a smaller leg bag that fits under your clothes. Wear the leg bag as told by the product maker. This may be above or below the knee, depending on the length of the tubing. Make sure that the leg bag is below the bladder. Make sure that the tubing does not have loops or too much tension. Do not wear your leg bag at night. How to care for your skin and catheter Supplies needed A clean washcloth. Water and mild soap.  A clean towel. Caring for your skin and catheter     Clean the skin around your catheter every day. Wash your hands with soap and water. Wet a clean washcloth in warm water and mild soap. Clean the skin around your urethra. If you are male: Gently spread the folds of skin around your vagina (labia). With the washcloth in your other hand, wipe the inner side of your labia on each side. Wipe from front to back. If you are male: Pull back any skin that covers the end of your penis (foreskin). With the washcloth in your other hand, wipe your penis in small circles. Start wiping at the tip of your penis, then move away from the catheter. Move the foreskin back in place, if needed. With your free hand, hold the catheter close to where it goes into your body. Keep holding the catheter during cleaning so it does not get pulled out. With the washcloth in your other hand, clean the catheter. Only wipe downward on the catheter, toward the drainage bag. Do not  wipe upward toward your body. Doing this may push germs into your urethra and cause infection. Use a clean towel to pat-dry the catheter and the skin around it. Make sure to wipe off all soap. Wash your hands with soap and water. Shower every day. Do not take baths. Do not use cream, ointment, or lotion on the area where the catheter goes into your body, unless your doctor tells you to. Do not use powders, sprays, or lotions on your genital area. Check your skin around the catheter every day for signs of infection. Check for: Redness, swelling, or pain. Fluid or blood. Warmth. Pus or a bad smell. How to empty the bag Supplies needed Rubbing alcohol. Gauze pad or cotton ball. Tape or a leg strap. Emptying the bag Pour the pee out of your bag when it is ?- full, or at least 2-3 times a day. Do this for your overnight bag and your leg bag. Wash your hands with soap and water. Separate (detach) the bag from your leg. Hold the bag over the toilet or a clean pail. Keep the bag lower than your hips and bladder. This is so the pee (urine) does not go back into the tube. Open the pour spout. It is at the bottom of the bag. Empty the pee into the toilet or pail. Do not let the pour spout touch any surface. Put rubbing alcohol on a gauze pad or cotton ball. Use the gauze pad or cotton ball to clean the pour spout. Close the pour spout. Attach the bag to your leg with tape or a leg strap. Wash your hands with soap and water. Follow instructions for cleaning the drainage bag. Instructions can come from: The product maker. Your doctor. How to change the bag Changing the bag Replace your bag when it starts to leak, smell bad, or look dirty. Wash your hands with soap and water. Separate the dirty bag from your leg. Pinch the catheter with your fingers so that pee does not spill out. Separate the catheter tube from the bag tube where these tubes connect (at the connection valve). Do not let the  tubes touch any surface. Clean the end of the catheter tube with an alcohol wipe. Use a different alcohol wipe to clean the end of the bag tube. Connect the catheter tube to the tube of the clean bag. Attach the clean bag to your leg with tape or a leg  strap. Do not make the bag tight on your leg. Wash your hands with soap and water. General instructions  Never pull on your catheter. Never try to take it out. Doing that can hurt you. Always wash your hands before and after you touch your catheter or bag. Use a mild, fragrance-free soap. If you do not have soap and water, use hand sanitizer. Always make sure there are no twists, bends, or kinks in the catheter tube. Always make sure there are no leaks in the catheter or bag. Drink enough fluid to keep your pee pale yellow. Do not take baths, swim, or use a hot tub. If you are male, wipe from front to back after you poop (have a bowel movement). Contact a doctor if: Your catheter gets clogged. Your catheter leaks. You have signs of infection at the catheter site, such as: Redness, swelling, or pain where the catheter goes into your body. Fluid, blood, pus, or a bad smell coming from the area where the catheter goes into your body. Skin feels warm where the catheter goes into your body. You have signs of a bladder infection, such as: Fever. Chills. Pee smells worse than usual. Cloudy pee. Pain in your belly, legs, lower back, or bladder. Vomiting or feel like vomiting. Get help right away if: You see blood in the catheter. Your pee is pink or red. Your bladder feels full. Your pee is not draining into the bag. Your catheter gets pulled out. Summary An indwelling urinary catheter is a thin tube that is placed into the bladder to help drain pee (urine) out of the body. The catheter is placed into the part of the body that drains pee from the bladder (urethra). Taking good care of your catheter will keep it working well. Always wash  your hands before and after touching your catheter or bag. Never pull on your catheter or try to take it out. This information is not intended to replace advice given to you by your health care provider. Make sure you discuss any questions you have with your health care provider. Document Revised: 02/10/2021 Document Reviewed: 02/10/2021 Elsevier Patient Education  [place].  Transrectal Ultrasound-Guided Prostate Biopsy, Care After What can I expect after the procedure? After the procedure, it is common to have: Pain and discomfort near your butt (rectum), especially while sitting. Pink-colored pee (urine). This is due to small amounts of blood in your pee. A burning feeling while peeing. Blood in your poop (stool). Bleeding from your butt. Blood in your semen. Follow these instructions at home: Medicines Take over-the-counter and prescription medicines only as told by your doctor. If you were given a sedative during your procedure, do not drive or use machines until your doctor says that it is safe. A sedative is a medicine that helps you relax. If you were prescribed an antibiotic medicine, take it as told by your doctor. Do not stop taking it even if you start to feel better. Activity  Return to your normal activities when your doctor says that it is safe. Ask your doctor when it is okay for you to have sex. You may have to avoid lifting. Ask your doctor how much you can safely lift. General instructions  Drink enough water to keep your pee pale yellow. Watch your pee, poop, and semen for new bleeding or bleeding that gets worse. Keep all follow-up visits. Contact a doctor if: You have any of these: Blood clots in your pee or poop. Blood in  your pee more than 2 weeks after the procedure. Blood in your semen more than 2 months after the procedure. New or worse bleeding in your pee, poop, or semen. Very bad belly pain. Your pee smells bad or unusual. You have trouble  peeing. Your lower belly feels firm. You have problems getting an erection. You feel like you may vomit (are nauseous), or you vomit. Get help right away if: You have a fever or chills. You have bright red pee. You have very bad pain that does not get better with medicine. You cannot pee. Summary After this procedure, it is common to have pain and discomfort near your butt, especially while sitting. You may have blood in your pee and poop. It is common to have blood in your semen. Get help right away if you have a fever or chills. This information is not intended to replace advice given to you by your health care provider. Make sure you discuss any questions you have with your health care provider. Document Revised: 12/06/2020 Document Reviewed: 12/06/2020 Elsevier Patient Education  [place]   The drugs that you were given will stay in your system until tomorrow so for the next 24 hours you should not:  Drive an automobile Make any legal decisions Drink any alcoholic beverage   You may resume regular meals tomorrow.  Today it is better to start with liquids and gradually work up to solid foods.  You may eat anything you prefer, but it is better to start with liquids, then soup and crackers, and gradually work up to solid foods.   Please notify your doctor immediately if you have any unusual bleeding, trouble breathing, redness and pain at the surgery site, drainage, fever, or pain not relieved by medication.    Additional Instructions:    Please contact your physician with any problems or Same Day Surgery at [phone], Monday through Friday 6 am to 4 pm, or [place] at [facility] number at [phone].

## 2022-05-04 NOTE — Op Note (Signed)
Preoperative diagnosis: 1.  Prostate nodule (N40.3)                                           2.  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  Bladder stone (N21.0)  Postoperative diagnosis: Same  Procedure: 1.  Photo vaporization of the prostate with greenlight laser (CPT [phone])                      2.  Litholapaxy of bladder stone with holmium laser (CPT [phone])                      3.  Transrectal ultrasound-guided needle biopsy of prostate (CPT [id number], 55700)  Surgeon: Otelia Limes. Yves Dill MD  Anesthesia: General  Indications:See the history and physical also. 66 year old (DATE OF BIRTH: 1955/11/03) Caucasian male with lower urinary tract symptoms and bladder stone.  He also has a palpable right sided prostate nodule.  PSA level was 1.2 ng/mL. MRI scan was nondiagnostic for prostate cancer because he had the presence of bilateral metal hip implants, which could not allow the radiologist to properly evaluate the prostate.  He did have a bladder stone present.  He comes in now for photovaporization of the prostate with GreenLight laser, litholapaxy of bladder stone and ultrasound-guided needle biopsy of the prostate. Uroflow study 04/06/2022 indicated maximum flow rate of 11 mL per second with average flow rate of 5.3 mL per second and a postvoid residual of 162 mL with a voided volume of 261 mL. Prostate MRI scan revealed a prostate volume of 22 mL, Exosome  IntelliScore was 61.16, which was above the cutoff for higher risk of high-grade prostate cancer and KUB on 03/17/2022 indicated a 12 x 12 mm bladder stone.After informed consent the above procedure(s) were requested     Technique and findings: After adequate general anesthesia been obtained the patient was placed into the left lateral decubitus position and DRE was performed.  The rectal vault was noted to be clear.  The ultrasound probe was placed and standard 12 core systematic biopsies were  performed.  The patient was then placed into dorsal lithotomy position and the perineum was prepped and draped in the usual fashion.  The 21 French laser scope was coupled to the camera and advanced into the bladder.  Bladder was mildly trabeculated.  Both ureteral orifices were identified and had clear reflux. A 12 mm bladder stone was present.  The 550 m holmium laser fiber was then passed through the scope and set at dust setting.  The stone was fully pulverized and then fragments evacuated with a Toomey syringe.  Lateral lobe obstructing hypertrophy of the prostate was noted.  The holmium laser fiber was removed and exchanged with the greenlight XPS laser fiber.  The power was set at 38 W.  Obstructive tissue from the bladder neck to the verumontanum was vaporized.  Bleeders were controlled with the coagulative setting.  After completion of vaporization the scope was removed and 10 cc of viscous Xylocaine instilled within the urethra and the bladder.  Catheter was irrigated until clear.  Blood loss  was minimal.  Procedure was then terminated and patient transferred to the recovery room in stable condition.

## 2022-05-04 NOTE — Anesthesia Preprocedure Evaluation (Addendum)
Anesthesia Evaluation  Patient identified by MRN, date of birth, ID band Patient awake    Reviewed: Allergy & Precautions, NPO status , Patient's Chart, lab work & pertinent test results  History of Anesthesia Complications Negative for: history of anesthetic complications  Airway Mallampati: III  TM Distance: >3 FB Neck ROM: full    Dental  (+) Chipped   Pulmonary former smoker   Pulmonary exam normal        Cardiovascular hypertension, On Medications Normal cardiovascular exam  PFO w/ atrial septal aneurysm    Neuro/Psych negative neurological ROS  negative psych ROS   GI/Hepatic negative GI ROS, Neg liver ROS,,,  Endo/Other  negative endocrine ROS    Renal/GU negative Renal ROS     Musculoskeletal   Abdominal   Peds  Hematology negative hematology ROS (+)   Anesthesia Other Findings Past Medical History: No date: Arthritis No date: BPH with elevated PSA No date: Complication of anesthesia     Comment:  irreg. heartbeat during surgery for hip replacement 2010              (Dr. Einar Gip consulted for abnormal EKG with RBBB but found              to be an old finding) No date: Gout No date: Heart murmur No date: Hypertension     Comment:  reviewed by Dr. Einar Gip, cleared for surg. on 01/20/2015,               ECHO results present  No date: Obesity No date: PFO with atrial septal aneurysm     Comment:  large PFO v/s small secundum ASD (saw Dr. Einar Gip 12/2014) 03/2022: Urinary bladder stone  Past Surgical History: 02/09/2015: INGUINAL HERNIA REPAIR; Bilateral     Comment:  Procedure: [place];  Surgeon: Donnie Mesa, MD;  Location: [place];                Service: General;  Laterality: Bilateral; 02/09/2015: INSERTION OF MESH; Bilateral     Comment:  Procedure: INSERTION OF MESH;  Surgeon: Donnie Mesa,               MD;  Location: [facility];  Service: General;   Laterality:               Bilateral; 2008: TOTAL HIP ARTHROPLASTY; Right 2010: TOTAL HIP ARTHROPLASTY; Left     Reproductive/Obstetrics negative OB ROS                             Anesthesia Physical Anesthesia Plan  ASA: 2  Anesthesia Plan: General   Post-op Pain Management: Toradol IV (intra-op)* and Ofirmev IV (intra-op)*   Induction: Intravenous  PONV Risk Score and Plan: 2 and Dexamethasone, Ondansetron, Treatment may vary due to age or medical condition and Midazolam  Airway Management Planned: Oral ETT  Additional Equipment:   Intra-op Plan:   Post-operative Plan: Extubation in OR  Informed Consent: I have reviewed the patients History and Physical, chart, labs and discussed the procedure including the risks, benefits and alternatives for the proposed anesthesia with the patient or authorized representative who has indicated his/her understanding and acceptance.     Dental Advisory Given  Plan Discussed with: Anesthesiologist, CRNA and Surgeon  Anesthesia Plan Comments: (Patient consented for risks of anesthesia including but not limited to:  -  adverse reactions to medications - damage to eyes, teeth, lips or other oral mucosa - nerve damage due to positioning  - sore throat or hoarseness - Damage to heart, brain, nerves, lungs, other parts of body or loss of life  Patient voiced understanding.)       Anesthesia Quick Evaluation

## 2022-05-04 NOTE — Anesthesia Postprocedure Evaluation (Signed)
Anesthesia Post Note  Patient: [name]  Procedure(s) Performed: PROSTATE BIOPSY/ TRUSPBX GREEN LIGHT LASER TURP (TRANSURETHRAL RESECTION OF PROSTATE CYSTOSCOPY WITH LITHOLAPAXY W/ War LASER  Patient location during evaluation: PACU Anesthesia Type: General Level of consciousness: awake and alert Pain management: pain level controlled Vital Signs Assessment: post-procedure vital signs reviewed and stable Respiratory status: spontaneous breathing, nonlabored ventilation, respiratory function stable and patient connected to nasal cannula oxygen Cardiovascular status: blood pressure returned to baseline and stable Postop Assessment: no apparent nausea or vomiting Anesthetic complications: no   No notable events documented.   Last Vitals:  Vitals:   05/04/22 1320 05/04/22 1355  BP: 126/74 112/71  Pulse: (!) 55 (!) 59  Resp: 16 16  Temp: (!) 36 C   SpO2: 98% 97%    Last Pain:  Vitals:   05/04/22 1355  TempSrc:   PainSc: 0-No pain                 Ilene Qua

## 2022-05-04 NOTE — Transfer of Care (Signed)
Immediate Anesthesia Transfer of Care Note  Patient: [name]  Procedure(s) Performed: PROSTATE BIOPSY/ TRUSPBX GREEN LIGHT LASER TURP (TRANSURETHRAL RESECTION OF PROSTATE CYSTOSCOPY WITH LITHOLAPAXY W/ HOLMIU LASER  Patient Location: PACU  Anesthesia Type:General  Level of Consciousness: drowsy and patient cooperative  Airway & Oxygen Therapy: Patient Spontanous Breathing and Patient connected to nasal cannula oxygen  Post-op Assessment: Report given to RN and Post -op Vital signs reviewed and stable  Post vital signs: Reviewed and stable  Last Vitals:  Vitals Value Taken Time  BP 113/75 05/04/22 1245  Temp 36.3 C 05/04/22 1221  Pulse 62 05/04/22 1248  Resp 14 05/04/22 1253  SpO2 98 % 05/04/22 1248  Vitals shown include unvalidated device data.  Last Pain:  Vitals:   05/04/22 1245  TempSrc:   PainSc: 0-No pain         Complications: No notable events documented.

## 2022-05-05 LAB — SURGICAL PATHOLOGY

## 2022-05-10 ENCOUNTER — Encounter: Payer: Self-pay | Admitting: Urology

## 2022-05-13 LAB — CALCULI, WITH PHOTOGRAPH (CLINICAL LAB)
Calcium Oxalate Dihydrate: 70 %
Calcium Oxalate Monohydrate: 30 %
Weight Calculi: 222 mg

## 2022-05-16 ENCOUNTER — Other Ambulatory Visit: Payer: Self-pay

## 2022-05-23 ENCOUNTER — Other Ambulatory Visit: Payer: Self-pay

## 2022-06-02 ENCOUNTER — Other Ambulatory Visit: Payer: Self-pay

## 2022-07-05 ENCOUNTER — Ambulatory Visit (INDEPENDENT_AMBULATORY_CARE_PROVIDER_SITE_OTHER): Payer: Commercial Managed Care - PPO | Admitting: Dermatology

## 2022-07-05 DIAGNOSIS — L814 Other melanin hyperpigmentation: Secondary | ICD-10-CM | POA: Diagnosis not present

## 2022-07-05 DIAGNOSIS — L578 Other skin changes due to chronic exposure to nonionizing radiation: Secondary | ICD-10-CM | POA: Diagnosis not present

## 2022-07-05 DIAGNOSIS — D229 Melanocytic nevi, unspecified: Secondary | ICD-10-CM

## 2022-07-05 DIAGNOSIS — D2361 Other benign neoplasm of skin of right upper limb, including shoulder: Secondary | ICD-10-CM

## 2022-07-05 DIAGNOSIS — Z1283 Encounter for screening for malignant neoplasm of skin: Secondary | ICD-10-CM

## 2022-07-05 DIAGNOSIS — L821 Other seborrheic keratosis: Secondary | ICD-10-CM

## 2022-07-05 DIAGNOSIS — D2372 Other benign neoplasm of skin of left lower limb, including hip: Secondary | ICD-10-CM | POA: Diagnosis not present

## 2022-07-05 NOTE — Progress Notes (Signed)
   Follow-Up Visit   Subjective  [name] is a 67 y.o. male who presents for the following: FBSE (Hx AK).  The patient presents for Total-Body Skin Exam (TBSE) for skin cancer screening and mole check.  The patient has spots, moles and lesions to be evaluated, some may be new or changing and the patient has concerns that these could be cancer.  The following portions of the chart were reviewed this encounter and updated as appropriate:   Tobacco  Allergies  Meds  Problems  Med Hx  Surg Hx  Fam Hx      Review of Systems:  No other skin or systemic complaints except as noted in HPI or Assessment and Plan.  Objective  Well appearing patient in no apparent distress; mood and affect are within normal limits.  A full examination was performed including scalp, head, eyes, ears, nose, lips, neck, chest, axillae, abdomen, back, buttocks, bilateral upper extremities, bilateral lower extremities, hands, feet, fingers, toes, fingernails, and toenails. All findings within normal limits unless otherwise noted below.    Assessment & Plan   Lentigines - Scattered tan macules - Due to sun exposure - Benign-appearing, observe - Recommend daily broad spectrum sunscreen SPF 30+ to sun-exposed areas, reapply every 2 hours as needed. - Call for any changes  Seborrheic Keratoses - Stuck-on, waxy, tan-brown papules and/or plaques  - Benign-appearing - Discussed benign etiology and prognosis. - Observe - Call for any changes  Melanocytic Nevi - Tan-brown and/or pink-flesh-colored symmetric macules and papules - Benign appearing on exam today - Observation - Call clinic for new or changing moles - Recommend daily use of broad spectrum spf 30+ sunscreen to sun-exposed areas.   Hemangiomas - Red papules - Discussed benign nature - Observe - Call for any changes  Actinic Damage - Chronic condition, secondary to cumulative UV/sun exposure - diffuse scaly erythematous macules  with underlying dyspigmentation - Recommend daily broad spectrum sunscreen SPF 30+ to sun-exposed areas, reapply every 2 hours as needed.  - Staying in the shade or wearing long sleeves, sun glasses (UVA+UVB protection) and wide brim hats (4-inch brim around the entire circumference of the hat) are also recommended for sun protection.  - Call for new or changing lesions.  Skin cancer screening performed today.  Dermatofibroma - Firm pink/brown papulenodule with dimple sign at left shin, right posterior shoulder - Benign appearing - Call for any changes  Return for TBSE, Hx AK 1-2 years.  Graciella Belton, RMA, am acting as scribe for Forest Gleason, MD .  Documentation: I have reviewed the above documentation for accuracy and completeness, and I agree with the above.  Forest Gleason, MD

## 2022-07-05 NOTE — Patient Instructions (Addendum)
Gentle Skin Care Guide  1. Bathe no more than once a day.  2. Avoid bathing in hot water  3. Use a mild soap like Dove, Vanicream, Cetaphil, CeraVe. Can use Lever 2000 or Cetaphil antibacterial soap  4. Use soap only where you need it. On most days, use it under your arms, between your legs, and on your feet. Let the water rinse other areas unless visibly dirty.  5. When you get out of the bath/shower, use a towel to gently blot your skin dry, don't rub it.  6. While your skin is still a little damp, apply a moisturizing cream such as Vanicream, CeraVe, Cetaphil, Eucerin, Sarna lotion or plain Vaseline Jelly. For hands apply Neutrogena [place] Hand Cream or Excipial Hand Cream.  7. Reapply moisturizer any time you start to itch or feel dry.  8. Sometimes using free and clear laundry detergents can be helpful. Fabric softener sheets should be avoided. Downy Free & Gentle liquid, or any liquid fabric softener that is free of dyes and perfumes, it acceptable to use  9. If your doctor has given you prescription creams you may apply moisturizers over them     Recommend taking Heliocare sun protection supplement daily in sunny weather for additional sun protection. For maximum protection on the sunniest days, you can take up to 2 capsules of regular Heliocare OR take 1 capsule of Heliocare Ultra. For prolonged exposure (such as a full day in the sun), you can repeat your dose of the supplement 4 hours after your first dose. Heliocare can be purchased at [employer], at some [facility] or at [email or web address].    Melanoma ABCDEs  Melanoma is the most dangerous type of skin cancer, and is the leading cause of death from skin disease.  You are more likely to develop melanoma if you: Have light-colored skin, light-colored eyes, or red or blond hair Spend a lot of time in the sun Tan regularly, either outdoors or in a tanning bed Have had blistering sunburns, especially during  childhood Have a close family member who has had a melanoma Have atypical moles or large birthmarks  Early detection of melanoma is key since treatment is typically straightforward and cure rates are extremely high if we catch it early.   The first sign of melanoma is often a change in a mole or a new dark spot.  The ABCDE system is a way of remembering the signs of melanoma.  A for asymmetry:  The two halves do not match. B for border:  The edges of the growth are irregular. C for color:  A mixture of colors are present instead of an even brown color. D for diameter:  Melanomas are usually (but not always) greater than 58m - the size of a pencil eraser. E for evolution:  The spot keeps changing in size, shape, and color.  Please check your skin once per month between visits. You can use a small mirror in front and a large mirror behind you to keep an eye on the back side or your body.   If you see any new or changing lesions before your next follow-up, please call to schedule a visit.  Please continue daily skin protection including broad spectrum sunscreen SPF 30+ to sun-exposed areas, reapplying every 2 hours as needed when you're outdoors.    Due to recent changes in healthcare laws, you may see results of your pathology and/or laboratory studies on MyChart before the doctors have had a chance to  review them. We understand that in some cases there may be results that are confusing or concerning to you. Please understand that not all results are received at the same time and often the doctors may need to interpret multiple results in order to provide you with the best plan of care or course of treatment. Therefore, we ask that you please give [place] 2 business days to thoroughly review all your results before contacting the office for clarification. Should we see a critical lab result, you will be contacted sooner.   If You Need Anything After Your Visit  If you have any questions or concerns  for your doctor, please call our main line at [phone] and press option 4 to reach your doctor's medical assistant. If no one answers, please leave a voicemail as directed and we will return your call as soon as possible. Messages left after 4 pm will be answered the following business day.   You may also send [place] a message via [place]. We typically respond to MyChart messages within 1-2 business days.  For prescription refills, please ask your pharmacy to contact our office. Our fax number is [phone].  If you have an urgent issue when the clinic is closed that cannot wait until the next business day, you can page your doctor at the number below.    Please note that while we do our best to be available for urgent issues outside of office hours, we are not available 24/7.   If you have an urgent issue and are unable to reach [place], you may choose to seek medical care at your doctor's office, retail clinic, urgent care center, or emergency room.  If you have a medical emergency, please immediately call 911 or go to the emergency department.  Pager Numbers  - Dr. Nehemiah Massed: [phone]  - Dr. Laurence Ferrari: [phone]  - Dr. Nicole Kindred: [phone]  In the event of inclement weather, please call our main line at [phone] for an update on the status of any delays or closures.  Dermatology Medication Tips: Please keep the boxes that topical medications come in in order to help keep track of the instructions about where and how to use these. Pharmacies typically print the medication instructions only on the boxes and not directly on the medication tubes.   If your medication is too expensive, please contact our office at [phone] option 4 or send [place] a message through [place].   We are unable to tell what your co-pay for medications will be in advance as this is different depending on your insurance coverage. However, we may be able to find a substitute medication at lower cost or fill out  paperwork to get insurance to cover a needed medication.   If a prior authorization is required to get your medication covered by your insurance company, please allow [place] 1-2 business days to complete this process.  Drug prices often vary depending on where the prescription is filled and some pharmacies may offer cheaper prices.  The website www.goodrx.com contains coupons for medications through different pharmacies. The prices here do not account for what the cost may be with help from insurance (it may be cheaper with your insurance), but the website can give you the price if you did not use any insurance.  - You can print the associated coupon and take it with your prescription to the pharmacy.  - You may also stop by our office during regular business hours and pick up a GoodRx coupon card.  -  If you need your prescription sent electronically to a different pharmacy, notify our office through [facility] or by phone at [phone] option 4.     Si Usted Necesita Algo Despus de Su Visita  Tambin puede enviarnos un mensaje a travs de Pharmacist, community. Por lo general respondemos a los mensajes de MyChart en el transcurso de 1 a 2 das hbiles.  Para renovar recetas, por favor pida a su farmacia que se ponga en contacto con nuestra oficina. Harland Dingwall de fax es [place] [phone].  Si tiene un asunto urgente cuando la clnica est cerrada y que no puede esperar hasta el siguiente da hbil, puede llamar/localizar a su doctor(a) al nmero que aparece a continuacin.   Por favor, tenga en cuenta que aunque hacemos todo lo posible para estar disponibles para asuntos urgentes fuera del horario de [place], no estamos disponibles las 24 horas del da, los 7 das de la [place].   Si tiene un problema urgente y no puede comunicarse con nosotros, puede optar por buscar atencin mdica  en el consultorio de su doctor(a), en una clnica privada, en un centro de atencin urgente o en una sala de  emergencias.  Si tiene Engineering geologist, por favor llame inmediatamente al 911 o vaya a la sala de emergencias.  Nmeros de bper  - Dr. Nehemiah Massed: [phone]  - Dra. Moye: [phone]  - Dra. Nicole Kindred: [phone]  En caso de inclemencias del [place], por favor llame a Johnsie Kindred principal al [phone] para una actualizacin sobre el [place] de cualquier retraso o cierre.  Consejos para la medicacin en dermatologa: Por favor, guarde las cajas en las que vienen los medicamentos de uso tpico para ayudarle a seguir las instrucciones sobre dnde y cmo usarlos. Las farmacias generalmente imprimen las instrucciones del medicamento slo en las cajas y no directamente en los tubos del [place].   Si su medicamento es muy caro, por favor, pngase en contacto con Zigmund Daniel llamando al [phone] y presione la opcin 4 o envenos un mensaje a travs de Pharmacist, community.   No podemos decirle cul ser su copago por los medicamentos por adelantado ya que esto es diferente dependiendo de la cobertura de su seguro. Sin embargo, es posible que podamos encontrar un medicamento sustituto a Electrical engineer un formulario para que el seguro cubra el medicamento que se considera necesario.   Si se requiere una autorizacin previa para que su compaa de seguros [place] su medicamento, por favor permtanos de 1 a 2 das hbiles para completar este proceso.  Los precios de los medicamentos varan con frecuencia dependiendo del Environmental consultant de dnde se surte la receta y alguna farmacias pueden ofrecer precios ms baratos.  El sitio web www.goodrx.com tiene cupones para medicamentos de Airline pilot. Los precios aqu no tienen en cuenta lo que podra costar con la ayuda del seguro (puede ser ms barato con su seguro), pero el sitio web puede darle el precio si no utiliz Research scientist (physical sciences).  - Puede imprimir el cupn correspondiente y llevarlo con su receta a la farmacia.  - Tambin puede pasar por  nuestra oficina durante el horario de atencin regular y Charity fundraiser una tarjeta de cupones de GoodRx.  - Si necesita que su receta se enve electrnicamente a una farmacia diferente, informe a nuestra oficina a travs de MyChart de  o por telfono llamando al [phone] y presione la opcin 4.

## 2022-07-10 ENCOUNTER — Other Ambulatory Visit: Payer: Self-pay

## 2022-07-15 ENCOUNTER — Encounter: Payer: Self-pay | Admitting: Dermatology

## 2022-07-21 ENCOUNTER — Other Ambulatory Visit: Payer: Self-pay

## 2022-07-21 DIAGNOSIS — Z03818 Encounter for observation for suspected exposure to other biological agents ruled out: Secondary | ICD-10-CM | POA: Diagnosis not present

## 2022-07-21 DIAGNOSIS — J019 Acute sinusitis, unspecified: Secondary | ICD-10-CM | POA: Diagnosis not present

## 2022-07-21 DIAGNOSIS — J029 Acute pharyngitis, unspecified: Secondary | ICD-10-CM | POA: Diagnosis not present

## 2022-07-21 DIAGNOSIS — J209 Acute bronchitis, unspecified: Secondary | ICD-10-CM | POA: Diagnosis not present

## 2022-07-21 DIAGNOSIS — B9689 Other specified bacterial agents as the cause of diseases classified elsewhere: Secondary | ICD-10-CM | POA: Diagnosis not present

## 2022-07-21 MED ORDER — AMLODIPINE BESYLATE 5 MG PO TABS
5.0000 mg | ORAL_TABLET | Freq: Every day | ORAL | 1 refills | Status: DC
  Filled 2022-07-21: qty 90, 90d supply, fill #0
  Filled 2022-10-22: qty 90, 90d supply, fill #1

## 2022-07-21 MED ORDER — AZITHROMYCIN 250 MG PO TABS
ORAL_TABLET | ORAL | 0 refills | Status: AC
  Filled 2022-07-21: qty 6, 5d supply, fill #0

## 2022-07-21 MED ORDER — BENZONATATE 200 MG PO CAPS
200.0000 mg | ORAL_CAPSULE | Freq: Three times a day (TID) | ORAL | 0 refills | Status: DC | PRN
  Filled 2022-07-21: qty 20, 7d supply, fill #0

## 2022-07-21 MED ORDER — PREDNISONE 20 MG PO TABS
20.0000 mg | ORAL_TABLET | Freq: Two times a day (BID) | ORAL | 0 refills | Status: DC
  Filled 2022-07-21: qty 10, 5d supply, fill #0

## 2022-07-24 ENCOUNTER — Other Ambulatory Visit: Payer: Self-pay

## 2022-07-31 ENCOUNTER — Other Ambulatory Visit: Payer: Self-pay

## 2022-07-31 MED ORDER — AMOXICILLIN 500 MG PO CAPS
500.0000 mg | ORAL_CAPSULE | ORAL | 0 refills | Status: DC
  Filled 2022-07-31: qty 8, 7d supply, fill #0

## 2022-08-31 ENCOUNTER — Other Ambulatory Visit: Payer: Self-pay

## 2022-09-20 ENCOUNTER — Other Ambulatory Visit: Payer: Self-pay

## 2022-09-20 DIAGNOSIS — R3915 Urgency of urination: Secondary | ICD-10-CM | POA: Diagnosis not present

## 2022-09-20 DIAGNOSIS — N5201 Erectile dysfunction due to arterial insufficiency: Secondary | ICD-10-CM | POA: Diagnosis not present

## 2022-09-20 DIAGNOSIS — N403 Nodular prostate with lower urinary tract symptoms: Secondary | ICD-10-CM | POA: Diagnosis not present

## 2022-09-20 MED ORDER — SILDENAFIL CITRATE 20 MG PO TABS
20.0000 mg | ORAL_TABLET | ORAL | 99 refills | Status: DC | PRN
  Filled 2022-09-20: qty 30, 6d supply, fill #0

## 2022-10-16 ENCOUNTER — Other Ambulatory Visit: Payer: Self-pay

## 2022-11-03 DIAGNOSIS — I10 Essential (primary) hypertension: Secondary | ICD-10-CM | POA: Diagnosis not present

## 2022-11-03 DIAGNOSIS — Z136 Encounter for screening for cardiovascular disorders: Secondary | ICD-10-CM | POA: Diagnosis not present

## 2022-11-07 ENCOUNTER — Other Ambulatory Visit: Payer: Self-pay

## 2022-11-07 MED ORDER — LOSARTAN POTASSIUM 50 MG PO TABS
50.0000 mg | ORAL_TABLET | Freq: Every day | ORAL | 1 refills | Status: DC
  Filled 2022-11-07: qty 90, 90d supply, fill #0
  Filled 2023-02-06: qty 90, 90d supply, fill #1

## 2022-11-08 DIAGNOSIS — E6609 Other obesity due to excess calories: Secondary | ICD-10-CM | POA: Diagnosis not present

## 2022-11-08 DIAGNOSIS — Z9189 Other specified personal risk factors, not elsewhere classified: Secondary | ICD-10-CM | POA: Diagnosis not present

## 2022-11-08 DIAGNOSIS — Z6831 Body mass index (BMI) 31.0-31.9, adult: Secondary | ICD-10-CM | POA: Diagnosis not present

## 2022-11-08 DIAGNOSIS — Z0000 Encounter for general adult medical examination without abnormal findings: Secondary | ICD-10-CM | POA: Diagnosis not present

## 2022-11-08 DIAGNOSIS — R7303 Prediabetes: Secondary | ICD-10-CM | POA: Diagnosis not present

## 2022-11-08 DIAGNOSIS — I10 Essential (primary) hypertension: Secondary | ICD-10-CM | POA: Diagnosis not present

## 2022-11-10 ENCOUNTER — Other Ambulatory Visit: Payer: Self-pay

## 2022-11-10 DIAGNOSIS — Z0000 Encounter for general adult medical examination without abnormal findings: Secondary | ICD-10-CM

## 2022-11-10 DIAGNOSIS — Z9189 Other specified personal risk factors, not elsewhere classified: Secondary | ICD-10-CM

## 2022-11-21 ENCOUNTER — Ambulatory Visit
Admission: RE | Admit: 2022-11-21 | Discharge: 2022-11-21 | Disposition: A | Discharge status: Home / Self Care | Payer: Commercial Managed Care - PPO | Source: Ambulatory Visit | Attending: Family Medicine | Admitting: Family Medicine

## 2022-11-21 DIAGNOSIS — Z0000 Encounter for general adult medical examination without abnormal findings: Secondary | ICD-10-CM | POA: Insufficient documentation

## 2022-11-21 DIAGNOSIS — Z9189 Other specified personal risk factors, not elsewhere classified: Secondary | ICD-10-CM | POA: Insufficient documentation

## 2022-11-23 ENCOUNTER — Other Ambulatory Visit: Payer: Self-pay

## 2022-11-23 MED ORDER — FLUTICASONE PROPIONATE 50 MCG/ACT NA SUSP
2.0000 | Freq: Every day | NASAL | 1 refills | Status: DC
  Filled 2022-11-23: qty 48, 90d supply, fill #0
  Filled 2023-04-05: qty 48, 90d supply, fill #1

## 2022-11-28 ENCOUNTER — Other Ambulatory Visit: Payer: Self-pay

## 2022-12-07 ENCOUNTER — Other Ambulatory Visit: Payer: Self-pay

## 2022-12-07 DIAGNOSIS — I2510 Atherosclerotic heart disease of native coronary artery without angina pectoris: Secondary | ICD-10-CM | POA: Diagnosis not present

## 2022-12-07 MED ORDER — ROSUVASTATIN CALCIUM 10 MG PO TABS
10.0000 mg | ORAL_TABLET | Freq: Every day | ORAL | 3 refills | Status: DC
  Filled 2022-12-07: qty 30, 30d supply, fill #0
  Filled 2023-01-08: qty 30, 30d supply, fill #1

## 2022-12-11 ENCOUNTER — Other Ambulatory Visit: Payer: Self-pay

## 2022-12-26 ENCOUNTER — Encounter: Payer: Self-pay | Admitting: Cardiology

## 2022-12-26 ENCOUNTER — Ambulatory Visit: Payer: Commercial Managed Care - PPO | Admitting: Cardiology

## 2022-12-26 VITALS — BP 119/72 | HR 55 | Resp 16 | Ht 69.0 in | Wt 207.8 lb

## 2022-12-26 DIAGNOSIS — Q211 Atrial septal defect, unspecified: Secondary | ICD-10-CM | POA: Diagnosis not present

## 2022-12-26 DIAGNOSIS — R931 Abnormal findings on diagnostic imaging of heart and coronary circulation: Secondary | ICD-10-CM | POA: Diagnosis not present

## 2022-12-26 DIAGNOSIS — E7800 Pure hypercholesterolemia, unspecified: Secondary | ICD-10-CM

## 2022-12-26 NOTE — Progress Notes (Signed)
Primary Physician/Referring:  Marisue Ivan, MD  Patient ID: [name], male    DOB: April 28, 1956, 67 y.o.   MRN: [id number]  No chief complaint on file.  HPI:    [name]  is a 67 y.o. Caucasian male patient with hypertension, small fenestrated ASD versus small secundum ASD by echocardiogram in 2016, essentially asymptomatic referred to me for evaluation of elevated coronary calcium score.  Past Medical History:  Diagnosis Date  . Arthritis   . BPH with elevated PSA   . Complication of anesthesia    irreg. heartbeat during surgery for hip replacement 2010 (Dr. Jacinto Halim consulted for abnormal EKG with RBBB but found to be an old finding)  . Gout   . Heart murmur   . Hypertension    reviewed by Dr. Jacinto Halim, cleared for surg. on 01/20/2015, ECHO results present   . Obesity   . PFO with atrial septal aneurysm    large PFO v/s small secundum ASD (saw Dr. Jacinto Halim 12/2014)  . Urinary bladder stone 03/2022   Past Surgical History:  Procedure Laterality Date  . CYSTOSCOPY WITH LITHOLAPAXY N/A 05/04/2022   Procedure: CYSTOSCOPY WITH LITHOLAPAXY W/ HOLMIU LASER;  Surgeon: Orson Ape, MD;  Location: [facility];  Service: Urology;  Laterality: N/A;  . GREEN LIGHT LASER TURP (TRANSURETHRAL RESECTION OF PROSTATE N/A 05/04/2022   Procedure: GREEN LIGHT LASER TURP (TRANSURETHRAL RESECTION OF PROSTATE;  Surgeon: Orson Ape, MD;  Location: [facility];  Service: Urology;  Laterality: N/A;  . INGUINAL HERNIA REPAIR Bilateral 02/09/2015   Procedure: LAPAROSCOPIC BILATERAL INGUINAL HERNIA REPAIRS;  Surgeon: Manus Rudd, MD;  Location: [facility];  Service: General;  Laterality: Bilateral;  . INSERTION OF MESH Bilateral 02/09/2015   Procedure: INSERTION OF MESH;  Surgeon: Manus Rudd, MD;  Location: [facility];  Service: General;  Laterality: Bilateral;  . PROSTATE BIOPSY N/A 05/04/2022   Procedure: PROSTATE BIOPSY/ TRUSPBX;  Surgeon: Orson Ape, MD;  Location: [facility];  Service: Urology;   Laterality: N/A;  . TOTAL HIP ARTHROPLASTY Right 2008  . TOTAL HIP ARTHROPLASTY Left 2010   Family History  Problem Relation Age of Onset  . Cancer Father        lung  . Cancer Sister        breast  . Heart disease Maternal Grandmother   . Cancer Paternal Grandmother     Social History   Tobacco Use  . Smoking status: Former    Packs/day: 0.80    Years: 20.00    Additional pack years: 0.00    Total pack years: 16.00    Types: Cigarettes    Quit date: 06/26/1996    Years since quitting: 26.5  . Smokeless tobacco: Never  Substance Use Topics  . Alcohol use: Yes    Comment: once a week or less a beer   Marital Status: Married  ROS  Review of Systems  Cardiovascular:  Negative for chest pain, dyspnea on exertion and leg swelling.  Objective      12/26/2022    9:29 AM 05/04/2022    1:55 PM 05/04/2022    1:20 PM  Vitals with BMI  Height 5\' 9"     Weight 207 lbs 13 oz    BMI 30.67    Systolic 119 112 528  Diastolic 72 71 74  Pulse 55 59 55   There were no vitals taken for this visit.  Physical Exam Neck:     Vascular: No carotid bruit or JVD.  Cardiovascular:     Rate and Rhythm: Normal rate and regular rhythm.     Pulses: Intact distal pulses.     Heart sounds: Normal heart sounds. No murmur heard.    No gallop.  Pulmonary:     Effort: Pulmonary effort is normal.     Breath sounds: Normal breath sounds.  Abdominal:     General: Bowel sounds are normal.     Palpations: Abdomen is soft.  Musculoskeletal:     Right lower leg: No edema.     Left lower leg: No edema.   Laboratory examination:   No results for input(s): "NA", "K", "CL", "CO2", "GLUCOSE", "BUN", "CREATININE", "CALCIUM", "GFRNONAA", "GFRAA" in the last 8760 hours.      Latest Ref Rng & Units 04/25/2022    2:20 PM 01/15/2016   11:03 AM 02/03/2015    9:10 AM  CBC  WBC 4.0 - 10.5 K/uL 6.3  6.1  6.2   Hemoglobin 13.0 - 17.0 g/dL 31.4  97.0  26.3   Hematocrit 39.0 - 52.0 % 37.6  44.8  45.0    Platelets 150 - 400 K/uL 215   135        Latest Ref Rng & Units 01/15/2016   10:41 AM 12/03/2014   12:49 PM 11/25/2013    1:34 PM  Hepatic Function  Total Protein 6.1 - 8.1 g/dL 7.2  7.5  7.6   Albumin 3.6 - 5.1 g/dL 4.5  4.1  4.5   AST 10 - 35 U/L 16  19  16    ALT 9 - 46 U/L 22  26  21    Alk Phosphatase 40 - 115 U/L 73  68  71   Total Bilirubin 0.2 - 1.2 mg/dL 0.7  0.7  0.7    Lab Results  Component Value Date   TSH 0.69 01/15/2016    External labs:   Labs 11/03/2022:  Serum glucose 94 mg, BUN 22, creatinine 1.2, EGFR 67 mL.  Total cholesterol 164, triglycerides 133, HDL 32, LDL 105.  Hb 15.0/HCT 43.7, platelets 162.  A1c 5.7%.  Radiology:    Cardiac Studies:   Echocardiogram 01/13/2015: Normal LV size, mild LVH, normal LVEF at 69%. Mild LAE.  Large PFO versus small secundum ASD present. Mild aortic regurgitation Mild tricuspid regurgitation, no evidence of pulmonary hypertension.  No significant change from 02/04/2014.  Coronary calcium score 11/27/2022 Total Agatston coronary calcium score 466. MESA database percentile 81st. LM: 0 LAD: 466 LCx: 0 RCA: 0 Visualized ascending and descending aorta mild aortic atherosclerosis.  Normal size. Extracardiac abnormalities: None.   EKG:   EKG 12/26/2022: Sinus bradycardia at rate of 52 bpm, normal axis, right bundle branch block.  Low-voltage complexes. Compared to 04/25/2022, no change.    Medications and allergies   Allergies  Allergen Reactions  . Morphine And Codeine Itching     Medication list   Current Outpatient Medications:  .  amLODipine (NORVASC) 5 MG tablet, Take 1 tablet (5 mg total) by mouth daily., Disp: 90 tablet, Rfl: 1 .  amoxicillin (AMOXIL) 500 MG capsule, Take 1 capsule (500 mg total) by mouth one hour before appointment., Disp: 8 capsule, Rfl: 0 .  aspirin EC 81 MG tablet, Take 81 mg by mouth daily., Disp: , Rfl:  .  benzonatate (TESSALON) 200 MG capsule, Take 1 capsule (200 mg total) by mouth  3 (three) times daily as needed for cough., Disp: 20 capsule, Rfl: 0 .  Cholecalciferol (VITAMIN D) 50 MCG (2000 UT) CAPS,  Take 2 capsules by mouth daily., Disp: , Rfl:  .  docusate sodium (COLACE) 100 MG capsule, Take 2 capsules (200 mg total) by mouth 2 (two) times daily., Disp: 120 capsule, Rfl: 3 .  fluticasone (FLONASE) 50 MCG/ACT nasal spray, Place 2 sprays into both nostrils daily., Disp: 48 g, Rfl: 1 .  levofloxacin (LEVAQUIN) 500 MG tablet, Take 1 tablet (500 mg total) by mouth daily., Disp: 7 tablet, Rfl: 0 .  loratadine (CLARITIN) 10 MG tablet, Take 1 tablet (10 mg total) by mouth daily., Disp: 30 tablet, Rfl: 0 .  losartan (COZAAR) 50 MG tablet, Take 1 tablet (50 mg total) by mouth daily., Disp: 90 tablet, Rfl: 1 .  Meth-Hyo-M Bl-Na Phos-Ph Sal (URIBEL) 118 MG CAPS, Take 1 capsule (118 mg total) by mouth every 6 (six) hours as needed (dysuria)., Disp: 40 capsule, Rfl: 3 .  predniSONE (DELTASONE) 20 MG tablet, Take 1 tablet (20 mg total) by mouth 2 (two) times daily for 5 days., Disp: 10 tablet, Rfl: 0 .  rosuvastatin (CRESTOR) 10 MG tablet, Take 1 tablet (10 mg total) by mouth once daily, Disp: 30 tablet, Rfl: 3 .  sildenafil (REVATIO) 20 MG tablet, TAKE 2 TO 5 TABLETS BY MOUTH AS NEEDED FOR ERECTILE DYSFUNCTION, Disp: 30 tablet, Rfl: 3 .  sildenafil (REVATIO) 20 MG tablet, Take 1-5 tablets (20-100 mg total) by mouth as needed or 2-3 tablets with 10mg  of tadalafil as needed., Disp: 30 tablet, Rfl: 99 .  tadalafil (CIALIS) 5 MG tablet, Take 1 tablet (5 mg total) by mouth daily., Disp: 90 tablet, Rfl: 3  Assessment  No diagnosis found.   No orders of the defined types were placed in this encounter.   No orders of the defined types were placed in this encounter.   There are no discontinued medications.   Recommendations:   SOLAN LAMMERS is a 67 y.o. Caucasian male patient with hypertension, small fenestrated ASD versus small secundum ASD by echocardiogram in 2016, essentially  asymptomatic referred to me for evaluation of elevated coronary calcium score.  1. Elevated coronary artery calcium score 11/27/22: Total Agatston score 466. MESA database percentile 81st. Patient is now being started on statin therapy.  I have explained to him the mechanism of aortic atherosclerosis and also coronary atherosclerosis and what it means by luminal narrowing as well.  Patient exercises regularly, he works in the maintenance with doing heavy activity without any symptoms of chest pain or dyspnea.  As most of the coronary calcification is noted in the LAD, I recommended a routine treadmill exercise stress test.  His LDL goal would be <70, he is now on statin that was started recently after the coronary calcification noted.  He has scheduled lipid profile testing in 2 to 3 months. - EKG 12-Lead - PCV CARDIAC STRESS TEST; Future - PCV ECHOCARDIOGRAM COMPLETE; Future  2. ASD (atrial septal defect) As dictated above he has a small fenestrated atrial septal defect versus a small ASD, will repeat echocardiogram. - PCV ECHOCARDIOGRAM COMPLETE; Future  3. Hypercholesteremia I reviewed his external labs, extensive discussion with the patient regarding plaque burden, unstable plaque, stabilization of plaque with statins.  Patient was reassured.  No limitations with regard to exercise and physical activity.  I will see him back in 3 months to close the loop.   Yates Decamp, MD, [facility] 12/26/2022, 7:35 AM Office: [phone]

## 2023-01-18 ENCOUNTER — Other Ambulatory Visit: Payer: Self-pay

## 2023-01-18 MED ORDER — AMLODIPINE BESYLATE 5 MG PO TABS
5.0000 mg | ORAL_TABLET | Freq: Every day | ORAL | 1 refills | Status: DC
  Filled 2023-01-18: qty 90, 90d supply, fill #0
  Filled 2023-04-25: qty 90, 90d supply, fill #1

## 2023-01-19 ENCOUNTER — Other Ambulatory Visit: Payer: Self-pay

## 2023-02-02 DIAGNOSIS — H43813 Vitreous degeneration, bilateral: Secondary | ICD-10-CM | POA: Diagnosis not present

## 2023-02-02 DIAGNOSIS — M3501 Sicca syndrome with keratoconjunctivitis: Secondary | ICD-10-CM | POA: Diagnosis not present

## 2023-02-02 DIAGNOSIS — H2513 Age-related nuclear cataract, bilateral: Secondary | ICD-10-CM | POA: Diagnosis not present

## 2023-02-23 ENCOUNTER — Ambulatory Visit: Payer: Commercial Managed Care - PPO

## 2023-02-23 ENCOUNTER — Other Ambulatory Visit: Payer: Commercial Managed Care - PPO

## 2023-02-23 DIAGNOSIS — R931 Abnormal findings on diagnostic imaging of heart and coronary circulation: Secondary | ICD-10-CM

## 2023-02-24 NOTE — Progress Notes (Signed)
Exercise treadmill stress test 02/23/2023: Exercise treadmill stress test performed using Bruce protocol.  Patient exercised for a total of 7 minutes and 29 seconds, achieving 9.3 METS, and 95% of age predicted maximum heart rate.  Exercise capacity was good.  No chest pain reported.  Normal heart rate and hemodynamic response.  Stress EKG is uninterpretable due to motion artifact. Inconclusive exercise treadmill stress test.    Although your EKG was uninterpretable, he had good exercise tolerance and there was no chest pain hence I would consider this overall as low risk.

## 2023-03-02 DIAGNOSIS — I2510 Atherosclerotic heart disease of native coronary artery without angina pectoris: Secondary | ICD-10-CM | POA: Diagnosis not present

## 2023-03-02 DIAGNOSIS — I700 Atherosclerosis of aorta: Secondary | ICD-10-CM | POA: Diagnosis not present

## 2023-03-02 DIAGNOSIS — R7303 Prediabetes: Secondary | ICD-10-CM | POA: Diagnosis not present

## 2023-03-02 DIAGNOSIS — Z0000 Encounter for general adult medical examination without abnormal findings: Secondary | ICD-10-CM | POA: Diagnosis not present

## 2023-03-02 DIAGNOSIS — E6609 Other obesity due to excess calories: Secondary | ICD-10-CM | POA: Diagnosis not present

## 2023-03-02 DIAGNOSIS — I10 Essential (primary) hypertension: Secondary | ICD-10-CM | POA: Diagnosis not present

## 2023-03-02 DIAGNOSIS — Z6831 Body mass index (BMI) 31.0-31.9, adult: Secondary | ICD-10-CM | POA: Diagnosis not present

## 2023-03-09 ENCOUNTER — Other Ambulatory Visit: Payer: Self-pay

## 2023-03-09 DIAGNOSIS — I2510 Atherosclerotic heart disease of native coronary artery without angina pectoris: Secondary | ICD-10-CM | POA: Diagnosis not present

## 2023-03-09 DIAGNOSIS — E7800 Pure hypercholesterolemia, unspecified: Secondary | ICD-10-CM | POA: Diagnosis not present

## 2023-03-09 DIAGNOSIS — I10 Essential (primary) hypertension: Secondary | ICD-10-CM | POA: Diagnosis not present

## 2023-03-09 MED ORDER — ROSUVASTATIN CALCIUM 20 MG PO TABS
20.0000 mg | ORAL_TABLET | Freq: Every day | ORAL | 1 refills | Status: DC
  Filled 2023-03-09: qty 90, 90d supply, fill #0
  Filled 2023-06-10: qty 90, 90d supply, fill #1
  Filled 2023-09-12: qty 20, 20d supply, fill #2

## 2023-03-21 DIAGNOSIS — N403 Nodular prostate with lower urinary tract symptoms: Secondary | ICD-10-CM | POA: Diagnosis not present

## 2023-03-21 DIAGNOSIS — R3915 Urgency of urination: Secondary | ICD-10-CM | POA: Diagnosis not present

## 2023-03-21 DIAGNOSIS — N5201 Erectile dysfunction due to arterial insufficiency: Secondary | ICD-10-CM | POA: Diagnosis not present

## 2023-03-29 ENCOUNTER — Ambulatory Visit: Payer: Commercial Managed Care - PPO | Admitting: Cardiology

## 2023-04-05 ENCOUNTER — Other Ambulatory Visit: Payer: Self-pay

## 2023-04-25 ENCOUNTER — Telehealth: Payer: Self-pay | Admitting: Cardiology

## 2023-04-25 NOTE — Telephone Encounter (Signed)
Pt is requesting a callback regarding his recent results. He stated he was told a nurse would reach out but he hadn't heard anything. Please advise

## 2023-04-25 NOTE — Telephone Encounter (Signed)
I spoke with patient. He saw stress test results in my chart.  Results reviewed with patient.  Echo needs to be rescheduled.  Appointment made for patient to see Dr Jacinto Halim on 11/27 at 11:40 for follow up.  Patient aware he will be called to schedule echo

## 2023-05-11 ENCOUNTER — Other Ambulatory Visit: Payer: Self-pay

## 2023-05-11 MED ORDER — LOSARTAN POTASSIUM 50 MG PO TABS
50.0000 mg | ORAL_TABLET | Freq: Every day | ORAL | 1 refills | Status: DC
  Filled 2023-05-11: qty 90, 90d supply, fill #0
  Filled 2023-08-10: qty 90, 90d supply, fill #1
  Filled 2023-11-11: qty 90, 90d supply, fill #2

## 2023-05-21 ENCOUNTER — Ambulatory Visit (HOSPITAL_COMMUNITY): Payer: Commercial Managed Care - PPO | Attending: Cardiology

## 2023-05-21 DIAGNOSIS — R931 Abnormal findings on diagnostic imaging of heart and coronary circulation: Secondary | ICD-10-CM | POA: Diagnosis not present

## 2023-05-21 DIAGNOSIS — Q211 Atrial septal defect, unspecified: Secondary | ICD-10-CM | POA: Insufficient documentation

## 2023-05-21 LAB — ECHOCARDIOGRAM COMPLETE: S' Lateral: 2.93 cm

## 2023-05-21 NOTE — Progress Notes (Signed)
Essentially normal echocardiogram, patient has an appointment to see me in 2 days and I will discuss with him regarding the echo findings.

## 2023-05-23 ENCOUNTER — Encounter: Payer: Self-pay | Admitting: Cardiology

## 2023-05-23 ENCOUNTER — Ambulatory Visit: Payer: Commercial Managed Care - PPO | Attending: Cardiology | Admitting: Cardiology

## 2023-05-23 VITALS — BP 120/80 | HR 54 | Ht 69.0 in | Wt 204.0 lb

## 2023-05-23 DIAGNOSIS — E7800 Pure hypercholesterolemia, unspecified: Secondary | ICD-10-CM

## 2023-05-23 DIAGNOSIS — I351 Nonrheumatic aortic (valve) insufficiency: Secondary | ICD-10-CM | POA: Diagnosis not present

## 2023-05-23 DIAGNOSIS — R931 Abnormal findings on diagnostic imaging of heart and coronary circulation: Secondary | ICD-10-CM

## 2023-05-23 DIAGNOSIS — I77810 Thoracic aortic ectasia: Secondary | ICD-10-CM

## 2023-05-23 NOTE — Patient Instructions (Signed)
Medication Instructions:  Your physician recommends that you continue on your current medications as directed. Please refer to the Current Medication list given to you today.  *If you need a refill on your cardiac medications before your next appointment, please call your pharmacy*   Lab Work: none If you have labs (blood work) drawn today and your tests are completely normal, you will receive your results only by: MyChart Message (if you have MyChart) OR A paper copy in the mail If you have any lab test that is abnormal or we need to change your treatment, we will call you to review the results.   Testing/Procedures: Your physician has requested that you have an echocardiogram.to be done in 2 years Echocardiography is a painless test that uses sound waves to create images of your heart. It provides your doctor with information about the size and shape of your heart and how well your heart's chambers and valves are working. This procedure takes approximately one hour. There are no restrictions for this procedure. Please do NOT wear cologne, perfume, aftershave, or lotions (deodorant is allowed). Please arrive 15 minutes prior to your appointment time.  Please note: We ask at that you not bring children with you during ultrasound (echo/ vascular) testing. Due to room size and safety concerns, children are not allowed in the ultrasound rooms during exams. Our front office staff cannot provide observation of children in our lobby area while testing is being conducted. An adult accompanying a patient to their appointment will only be allowed in the ultrasound room at the discretion of the ultrasound technician under special circumstances. We apologize for any inconvenience.    Follow-Up: At [facility], you and your health needs are our priority.  As part of our continuing mission to provide you with exceptional heart care, we have created designated Provider Care Teams.  These Care  Teams include your primary Cardiologist (physician) and Advanced Practice Providers (APPs -  Physician Assistants and Nurse Practitioners) who all work together to provide you with the care you need, when you need it.  We recommend signing up for the patient portal called "MyChart".  Sign up information is provided on this After Visit Summary.  MyChart is used to connect with patients for Virtual Visits (Telemedicine).  Patients are able to view lab/test results, encounter notes, upcoming appointments, etc.  Non-urgent messages can be sent to your provider as well.   To learn more about what you can do with MyChart, go to [email or web address].    Your next appointment:   2 year(s)  Provider:   Dr Jacinto Halim    Other Instructions

## 2023-05-23 NOTE — Progress Notes (Signed)
Cardiology Office Note:  .   Date:  05/23/2023  ID:  [name], DOB 08-Nov-1955, MRN [id number] PCP: James Ivan, MD  [facility] Providers Cardiologist:  None   History of Present Illness: .   [name] is a 67 y.o. Caucasian male patient with hypertension, small fenestrated ASD versus small secundum ASD by echocardiogram in 2016, essentially asymptomatic referred to me for evaluation of elevated coronary calcium score.  Discussed the use of AI scribe software for clinical note transcription with the patient, who gave verbal consent to proceed.  History of Present Illness   [name], a 67 year old male with a history of elevated coronary calcium score and a possible small hole in the heart, presents for a follow-up visit. 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 He denies any symptoms of chest pain or shortness of breath, even when climbing stairs. He recently underwent a stress test, which was read as uninterpretable due to baseline artifact. However, he was able to walk on the treadmill for almost eight minutes, reaching close to 95% of his age-predicted heart rate without any chest pain or irregular heartbeat. He also had an echocardiogram, which did not show the previously suspected hole in the heart. He has been put on cholesterol medication by his primary care physician after the detection of coronary calcium.        Review of Systems  Cardiovascular:  Negative for chest pain, dyspnea on exertion and leg swelling.    Labs   External Labs:  Labs 11/03/2022:  Serum glucose 94 mg, BUN 22, creatinine 1.2, EGFR 67 mL.  Total cholesterol 164, triglycerides 133, HDL 32, LDL 105.  Hb 15.0/HCT 43.7, platelets 162.  A1c 5.7%.  Physical Exam:   VS:  BP 120/80 Comment: Right arm 120/90  Pulse (!) 54   Ht 5\' 9"  (1.753 m)   Wt 204 lb (92.5 kg)   SpO2 98%   BMI 30.13 kg/m     Wt Readings from Last 3 Encounters:  05/23/23 204 lb (92.5 kg)  12/26/22 207 lb 12.8 oz (94.3 kg)  05/04/22 205 lb (93 kg)     Physical Exam Neck:     Vascular: No carotid bruit or JVD.  Cardiovascular:     Rate and Rhythm: Normal rate and regular rhythm.     Pulses: Intact distal pulses.     Heart sounds: Normal heart sounds. No murmur heard.    No gallop.  Pulmonary:     Effort: Pulmonary effort is normal.     Breath sounds: Normal breath sounds.  Abdominal:     General: Bowel sounds are normal.     Palpations: Abdomen is soft.  Musculoskeletal:     Right lower leg: No edema.     Left lower leg: No edema.     Studies Reviewed: Marland Kitchen    Coronary calcium score 11/27/2022 Total Agatston coronary calcium score 466. MESA database percentile 81st. LM: 0 LAD: 466 LCx: 0 RCA: 0 Vascular: Mild aortic atherosclerosis. The included aorta is upper normal in caliber. Extracardiac abnormalities: None.   ECHOCARDIOGRAM COMPLETE 05/21/2023   1. PFO/ASD no appreciated on this study consider bubble study or TEE if further investigation needed. 2. Left ventricular ejection fraction, by estimation, is 60 to 65%. The left ventricle has normal function. The left ventricle has no regional wall motion abnormalities. Left ventricular diastolic parameters were normal. The average left ventricular  global longitudinal strain is -17.4 %. The global longitudinal strain is normal. 3. Right ventricular systolic function is normal. The right ventricular size is normal. There is normal pulmonary artery systolic pressure. 4. Left atrial size was mildly dilated. 5. The mitral valve is normal in structure. Trivial mitral valve regurgitation. No evidence of mitral stenosis. 6. The aortic valve is tricuspid. There is moderate calcification of the aortic valve. There is moderate thickening of the aortic valve. Aortic valve regurgitation is mild. Aortic valve sclerosis is present, with no evidence of aortic valve  stenosis. 7. Aortic dilatation noted. There is mild dilatation of the aortic root, measuring 38 mm. There is mild dilatation of the ascending aorta, measuring 40 mm. 8. The inferior vena cava is normal in size with greater than 50% respiratory variability, suggesting right atrial pressure of 3 mmHg.  Exercise treadmill stress test 02/23/2023: Exercise treadmill stress test performed using Bruce protocol.  Patient exercised for a total of 7 minutes and 29 seconds, achieving 9.3 METS, and 95% of age predicted maximum heart rate.  Exercise capacity was good.  No chest pain reported.  Normal heart rate and hemodynamic response. Stress EKG is uninterpretable due to motion artifact. Inconclusive exercise treadmill stress test.    Personal impression:  Although your EKG was uninterpretable, he had good exercise tolerance and there was no chest pain hence I would consider this overall as low risk.   EKG:    EKG 12/26/2022: Sinus bradycardia at rate of 52 bpm, normal axis, right bundle branch block.  Low-voltage complexes. Compared to 04/25/2022, no change.  EKG:    EKG 12/26/2022: Sinus bradycardia at rate of 52 bpm, normal axis, right bundle branch block.  Low-voltage complexes. Compared to 04/25/2022, no change.  Medications and allergies    Allergies  Allergen Reactions   Morphine And Codeine Itching     Current Outpatient Medications:    amLODipine (NORVASC) 5 MG tablet, Take 1 tablet (5 mg total) by mouth daily., Disp: 90 tablet, Rfl: 1   aspirin EC 81 MG tablet, Take 81 mg by mouth daily., Disp: , Rfl:    Cholecalciferol (VITAMIN D) 50 MCG (2000 UT) CAPS, Take 2 capsules by mouth daily., Disp: , Rfl:    fluticasone (FLONASE) 50 MCG/ACT nasal spray, Place 2 sprays into both nostrils daily., Disp: 48 g, Rfl: 1   loratadine (CLARITIN) 10 MG tablet, Take 1 tablet (10 mg total) by mouth daily., Disp: 30 tablet, Rfl: 0   losartan (COZAAR) 50 MG tablet, Take 1 tablet (50 mg total) by mouth  daily., Disp: 100 tablet, Rfl: 1   rosuvastatin (CRESTOR) 20 MG tablet, Take 1 tablet (20 mg total) by mouth daily., Disp: 100 tablet, Rfl: 1   sildenafil (REVATIO) 20 MG tablet, TAKE 2 TO 5 TABLETS BY MOUTH AS NEEDED FOR ERECTILE DYSFUNCTION, Disp: 30 tablet, Rfl: 3   tadalafil (CIALIS) 5 MG tablet, Take 1 tablet (5 mg total) by mouth daily., Disp: 90 tablet, Rfl: 3   ASSESSMENT AND PLAN: .      ICD-10-CM   1. Elevated coronary artery calcium score 11/27/22: Total Agatston score 466. MESA database percentile 81st.  R93.1     2. Hypercholesteremia  E78.00     3. 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 Patient is asymptomatic with good exercise tolerance.  Stress test was uninterpretable but patient achieved 9.3 METs without symptoms, indicating low risk. -Continue current management.  Hypercholesterolemia LDL was 105, currently on statin therapy. -Ensure LDL is less than 70 on upcoming blood work. If not, advise to increase statin dosage.  Aortic Valve Calcification and Mild Regurgitation, mild aortic root dilatation and ascending aortic dilatation at 3.8 and 4.0 cm, however aortic size was apparently normal during coronary calcium score.  Will keep an eye on this. Incidental finding on echocardiogram. No symptoms or abnormal heart sounds on examination. -Repeat echocardiogram in 2 years to monitor progression.  Patent Foramen Ovale Previously reported on ultrasound, not seen on recent echocardiogram. Asymptomatic and incidental finding. -No further action required at this time.  Follow-up In 2 years for repeat echocardiogram and assessment of aortic valve calcification and regurgitation.     Signed,  Yates Decamp, MD, [facility] 05/23/2023, 12:39 PM [facility] [address] [place], [place] [address] Phone: [phone].  Fax:  [phone]

## 2023-06-05 DIAGNOSIS — Z125 Encounter for screening for malignant neoplasm of prostate: Secondary | ICD-10-CM | POA: Diagnosis not present

## 2023-06-05 DIAGNOSIS — I10 Essential (primary) hypertension: Secondary | ICD-10-CM | POA: Diagnosis not present

## 2023-06-05 DIAGNOSIS — E7800 Pure hypercholesterolemia, unspecified: Secondary | ICD-10-CM | POA: Diagnosis not present

## 2023-06-05 DIAGNOSIS — I2510 Atherosclerotic heart disease of native coronary artery without angina pectoris: Secondary | ICD-10-CM | POA: Diagnosis not present

## 2023-06-11 DIAGNOSIS — E66811 Obesity, class 1: Secondary | ICD-10-CM | POA: Diagnosis not present

## 2023-06-11 DIAGNOSIS — E7800 Pure hypercholesterolemia, unspecified: Secondary | ICD-10-CM | POA: Diagnosis not present

## 2023-06-11 DIAGNOSIS — E6609 Other obesity due to excess calories: Secondary | ICD-10-CM | POA: Diagnosis not present

## 2023-06-11 DIAGNOSIS — Z6830 Body mass index (BMI) 30.0-30.9, adult: Secondary | ICD-10-CM | POA: Diagnosis not present

## 2023-06-11 DIAGNOSIS — I10 Essential (primary) hypertension: Secondary | ICD-10-CM | POA: Diagnosis not present

## 2023-07-11 ENCOUNTER — Encounter: Payer: Commercial Managed Care - PPO | Admitting: Dermatology

## 2023-07-16 ENCOUNTER — Encounter: Payer: Self-pay | Admitting: Dermatology

## 2023-07-16 ENCOUNTER — Ambulatory Visit: Payer: Commercial Managed Care - PPO | Admitting: Dermatology

## 2023-07-16 DIAGNOSIS — D239 Other benign neoplasm of skin, unspecified: Secondary | ICD-10-CM

## 2023-07-16 DIAGNOSIS — Z872 Personal history of diseases of the skin and subcutaneous tissue: Secondary | ICD-10-CM

## 2023-07-16 DIAGNOSIS — L853 Xerosis cutis: Secondary | ICD-10-CM

## 2023-07-16 DIAGNOSIS — L814 Other melanin hyperpigmentation: Secondary | ICD-10-CM

## 2023-07-16 DIAGNOSIS — L821 Other seborrheic keratosis: Secondary | ICD-10-CM

## 2023-07-16 DIAGNOSIS — L570 Actinic keratosis: Secondary | ICD-10-CM | POA: Diagnosis not present

## 2023-07-16 DIAGNOSIS — W908XXA Exposure to other nonionizing radiation, initial encounter: Secondary | ICD-10-CM

## 2023-07-16 DIAGNOSIS — D229 Melanocytic nevi, unspecified: Secondary | ICD-10-CM

## 2023-07-16 DIAGNOSIS — D1801 Hemangioma of skin and subcutaneous tissue: Secondary | ICD-10-CM

## 2023-07-16 DIAGNOSIS — D2361 Other benign neoplasm of skin of right upper limb, including shoulder: Secondary | ICD-10-CM

## 2023-07-16 DIAGNOSIS — Z1283 Encounter for screening for malignant neoplasm of skin: Secondary | ICD-10-CM | POA: Diagnosis not present

## 2023-07-16 DIAGNOSIS — D2372 Other benign neoplasm of skin of left lower limb, including hip: Secondary | ICD-10-CM

## 2023-07-16 DIAGNOSIS — L578 Other skin changes due to chronic exposure to nonionizing radiation: Secondary | ICD-10-CM | POA: Diagnosis not present

## 2023-07-16 NOTE — Patient Instructions (Addendum)
 Cryotherapy Aftercare  Wash gently with soap and water everyday.   Apply Vaseline Jelly daily until healed.    Recommend daily broad spectrum sunscreen SPF 30+ to sun-exposed areas, reapply every 2 hours as needed. Call for new or changing lesions.  Staying in the shade or wearing long sleeves, sun glasses (UVA+UVB protection) and wide brim hats (4-inch brim around the entire circumference of the hat) are also recommended for sun protection.      Melanoma ABCDEs  Melanoma is the most dangerous type of skin cancer, and is the leading cause of death from skin disease.  You are more likely to develop melanoma if you: Have light-colored skin, light-colored eyes, or red or blond hair Spend a lot of time in the sun Tan regularly, either outdoors or in a tanning bed Have had blistering sunburns, especially during childhood Have a close family member who has had a melanoma Have atypical moles or large birthmarks  Early detection of melanoma is key since treatment is typically straightforward and cure rates are extremely high if we catch it early.   The first sign of melanoma is often a change in a mole or a new dark spot.  The ABCDE system is a way of remembering the signs of melanoma.  A for asymmetry:  The two halves do not match. B for border:  The edges of the growth are irregular. C for color:  A mixture of colors are present instead of an even brown color. D for diameter:  Melanomas are usually (but not always) greater than 6mm - the size of a pencil eraser. E for evolution:  The spot keeps changing in size, shape, and color.  Please check your skin once per month between visits. You can use a small mirror in front and a large mirror behind you to keep an eye on the back side or your body.   If you see any new or changing lesions before your next follow-up, please call to schedule a visit.  Please continue daily skin protection including broad spectrum sunscreen SPF 30+ to  sun-exposed areas, reapplying every 2 hours as needed when you're outdoors.   Staying in the shade or wearing long sleeves, sun glasses (UVA+UVB protection) and wide brim hats (4-inch brim around the entire circumference of the hat) are also recommended for sun protection.      Gentle Skin Care Guide  1. Bathe no more than once a day.  2. Avoid bathing in hot water  3. Use a mild soap like Dove, Vanicream, Cetaphil, CeraVe. Can use Lever 2000 or Cetaphil antibacterial soap  4. Use soap only where you need it. On most days, use it under your arms, between your legs, and on your feet. Let the water rinse other areas unless visibly dirty.  5. When you get out of the bath/shower, use a towel to gently blot your skin dry, don't rub it.  6. While your skin is still a little damp, apply a moisturizing cream such as Vanicream, CeraVe, Cetaphil, Eucerin, Sarna lotion or plain Vaseline Jelly. For hands apply Neutrogena [place] Hand Cream or Excipial Hand Cream.  7. Reapply moisturizer any time you start to itch or feel dry.  8. Sometimes using free and clear laundry detergents can be helpful. Fabric softener sheets should be avoided. Downy Free & Gentle liquid, or any liquid fabric softener that is free of dyes and perfumes, it acceptable to use  9. If your doctor has given you prescription creams you may  apply moisturizers over them        Due to recent changes in healthcare laws, you may see results of your pathology and/or laboratory studies on MyChart before the doctors have had a chance to review them. We understand that in some cases there may be results that are confusing or concerning to you. Please understand that not all results are received at the same time and often the doctors may need to interpret multiple results in order to provide you with the best plan of care or course of treatment. Therefore, we ask that you please give [place] 2 business days to thoroughly review all your  results before contacting the office for clarification. Should we see a critical lab result, you will be contacted sooner.   If You Need Anything After Your Visit  If you have any questions or concerns for your doctor, please call our main line at [phone] and press option 4 to reach your doctor's medical assistant. If no one answers, please leave a voicemail as directed and we will return your call as soon as possible. Messages left after 4 pm will be answered the following business day.   You may also send [place] a message via MyChart. We typically respond to MyChart messages within 1-2 business days.  For prescription refills, please ask your pharmacy to contact our office. Our fax number is [phone].  If you have an urgent issue when the clinic is closed that cannot wait until the next business day, you can page your doctor at the number below.    Please note that while we do our best to be available for urgent issues outside of office hours, we are not available 24/7.   If you have an urgent issue and are unable to reach [place], you may choose to seek medical care at your doctor's office, retail clinic, urgent care center, or emergency room.  If you have a medical emergency, please immediately call 911 or go to the emergency department.  Pager Numbers  - Dr. Gwen Pounds: [phone]  - Dr. Roseanne Reno: [phone]  - Dr. Katrinka Blazing: [phone]   In the event of inclement weather, please call our main line at [phone] for an update on the status of any delays or closures.  Dermatology Medication Tips: Please keep the boxes that topical medications come in in order to help keep track of the instructions about where and how to use these. Pharmacies typically print the medication instructions only on the boxes and not directly on the medication tubes.   If your medication is too expensive, please contact our office at [phone] option 4 or send [place] a message through MyChart.   We are  unable to tell what your co-pay for medications will be in advance as this is different depending on your insurance coverage. However, we may be able to find a substitute medication at lower cost or fill out paperwork to get insurance to cover a needed medication.   If a prior authorization is required to get your medication covered by your insurance company, please allow [place] 1-2 business days to complete this process.  Drug prices often vary depending on where the prescription is filled and some pharmacies may offer cheaper prices.  The website www.goodrx.com contains coupons for medications through different pharmacies. The prices here do not account for what the cost may be with help from insurance (it may be cheaper with your insurance), but the website can give you the price if you did not use any  insurance.  - You can print the associated coupon and take it with your prescription to the pharmacy.  - You may also stop by our office during regular business hours and pick up a GoodRx coupon card.  - If you need your prescription sent electronically to a different pharmacy, notify our office through [facility] or by phone at [phone] option 4.     Si Usted Necesita Algo Despus de Su Visita  Tambin puede enviarnos un mensaje a travs de Clinical cytogeneticist. Por lo general respondemos a los mensajes de MyChart en el transcurso de 1 a 2 das hbiles.  Para renovar recetas, por favor pida a su farmacia que se ponga en contacto con nuestra oficina. Annie Sable de fax es [place] [phone].  Si tiene un asunto urgente cuando la clnica est cerrada y que no puede esperar hasta el siguiente da hbil, puede llamar/localizar a su doctor(a) al nmero que aparece a continuacin.   Por favor, tenga en cuenta que aunque hacemos todo lo posible para estar disponibles para asuntos urgentes fuera del horario de [place], no estamos disponibles las 24 horas del da, los 7 [address]  [address] de la [place].   Si tiene un  problema urgente y no puede comunicarse con nosotros, puede optar por buscar atencin mdica  en el consultorio de su doctor(a), en una clnica privada, en un centro de atencin urgente o en una sala de emergencias.  Si tiene Engineer, drilling, por favor llame inmediatamente al 911 o vaya a la sala de emergencias.  Nmeros de bper  - Dr. Gwen Pounds: [phone]  - Dra. Roseanne Reno: [phone]  - Dr. Katrinka Blazing: [phone]   En caso de inclemencias del tiempo, por favor llame a Lacy Duverney principal al [phone] para una actualizacin sobre el [place] de cualquier retraso o cierre.  Consejos para la medicacin en dermatologa: Por favor, guarde las cajas en las que vienen los medicamentos de uso tpico para ayudarle a seguir las instrucciones sobre dnde y cmo usarlos. Las farmacias generalmente imprimen las instrucciones del medicamento slo en las cajas y no directamente en los tubos del [place].   Si su medicamento es muy caro, por favor, pngase en contacto con Rolm Gala llamando al [phone] y presione la opcin 4 o envenos un mensaje a travs de Clinical cytogeneticist.   No podemos decirle cul ser su copago por los medicamentos por adelantado ya que esto es diferente dependiendo de la cobertura de su seguro. Sin embargo, es posible que podamos encontrar un medicamento sustituto a Audiological scientist un formulario para que el seguro cubra el medicamento que se considera necesario.   Si se requiere una autorizacin previa para que su compaa de seguros [place] su medicamento, por favor permtanos de 1 a 2 das hbiles para completar [address]  Los precios de los medicamentos varan con frecuencia dependiendo del Environmental consultant de dnde se surte la receta y alguna farmacias pueden ofrecer precios ms baratos.  El sitio web www.goodrx.com tiene cupones para medicamentos de Health and safety inspector. Los precios aqu no tienen en cuenta lo que podra costar con la ayuda del seguro (puede ser ms  barato con su seguro), pero el sitio web puede darle el precio si no utiliz Tourist information centre manager.  - Puede imprimir el cupn correspondiente y llevarlo con su receta a la farmacia.  - Tambin puede pasar por nuestra oficina durante el horario de atencin regular y Education officer, museum una tarjeta de cupones de GoodRx.  - Si necesita que su receta se enve electrnicamente a  una farmacia diferente, informe a nuestra oficina a travs de MyChart de  o por telfono llamando al [phone] y presione la opcin 4.

## 2023-07-16 NOTE — Progress Notes (Signed)
Follow-Up Visit   Subjective  [name] is a 68 y.o. male who presents for the following: Skin Cancer Screening and Full Body Skin Exam. Hx of AKs. No personal Hx of skin cancer or dysplastic nevi.   Areas of concern on face.   The patient presents for Total-Body Skin Exam (TBSE) for skin cancer screening and mole check. The patient has spots, moles and lesions to be evaluated, some may be new or changing and the patient may have concern these could be cancer.    The following portions of the chart were reviewed this encounter and updated as appropriate: medications, allergies, medical history  Review of Systems:  No other skin or systemic complaints except as noted in HPI or Assessment and Plan.  Objective  Well appearing patient in no apparent distress; mood and affect are within normal limits.  A full examination was performed including scalp, head, eyes, ears, nose, lips, neck, chest, axillae, abdomen, back, buttocks, bilateral upper extremities, bilateral lower extremities, hands, feet, fingers, toes, fingernails, and toenails. All findings within normal limits unless otherwise noted below.   Relevant physical exam findings are noted in the Assessment and Plan.  left Nasal Dorsum x1, R sideburn x1 (2) Erythematous thin papules/macules with gritty scale.   Assessment & Plan   SKIN CANCER SCREENING PERFORMED TODAY.  ACTINIC DAMAGE - Chronic condition, secondary to cumulative UV/sun exposure - diffuse scaly erythematous macules with underlying dyspigmentation - Recommend daily broad spectrum sunscreen SPF 30+ to sun-exposed areas, reapply every 2 hours as needed.  - Staying in the shade or wearing long sleeves, sun glasses (UVA+UVB protection) and wide brim hats (4-inch brim around the entire circumference of the hat) are also recommended for sun protection.  - Call for new or changing lesions.  LENTIGINES, SEBORRHEIC KERATOSES, HEMANGIOMAS - Benign normal skin  lesions - Benign-appearing - Call for any changes  MELANOCYTIC NEVI - Tan-brown and/or pink-flesh-colored symmetric macules and papules - Benign appearing on exam today - Observation - Call clinic for new or changing moles - Recommend daily use of broad spectrum spf 30+ sunscreen to sun-exposed areas.   DERMATOFIBROMA Exam: Firm pink/brown papulenodule with dimple sign at left shin and right posterior shoulder. Treatment Plan: A dermatofibroma is a benign growth possibly related to trauma, such as an insect bite, cut from shaving, or inflamed acne-type bump.  Treatment options to remove include shave or excision with resulting scar and risk of recurrence.  Since benign-appearing and not bothersome, will observe for now.   SEBORRHEIC KERATOSIS - Stuck-on, waxy, tan-brown papules and/or plaques on face - Benign-appearing - Discussed benign etiology and prognosis. - Observe - Call for any changes  Xerosis - diffuse xerotic patches at hands and legs - recommend gentle, hydrating skin care - gentle skin care handout given   AK (ACTINIC KERATOSIS) (2) left Nasal Dorsum x1, R sideburn x1 (2) Actinic keratoses are precancerous spots that appear secondary to cumulative UV radiation exposure/sun exposure over time. They are chronic with expected duration over 1 year. A portion of actinic keratoses will progress to squamous cell carcinoma of the skin. It is not possible to reliably predict which spots will progress to skin cancer and so treatment is recommended to prevent development of skin cancer.  Recommend daily broad spectrum sunscreen SPF 30+ to sun-exposed areas, reapply every 2 hours as needed.  Recommend staying in the shade or wearing long sleeves, sun glasses (UVA+UVB protection) and wide brim hats (4-inch brim around the entire  circumference of the hat). Call for new or changing lesions. Destruction of lesion - left Nasal Dorsum x1, R sideburn x1 (2) Complexity: simple    Destruction method: cryotherapy   Informed consent: discussed and consent obtained   Timeout:  patient name, date of birth, surgical site, and procedure verified Lesion destroyed using liquid nitrogen: Yes   Region frozen until ice ball extended beyond lesion: Yes   Cryo cycles: 1 or 2. Outcome: patient tolerated procedure well with no complications   Post-procedure details: wound care instructions given   Additional details:  Prior to procedure, discussed risks of blister formation, small wound, skin dyspigmentation, or rare scar following cryotherapy. Recommend Vaseline ointment to treated areas while healing.  MULTIPLE BENIGN NEVI   SEBORRHEIC KERATOSES   LENTIGINES   ACTINIC ELASTOSIS   CHERRY ANGIOMA   DERMATOFIBROMA   XEROSIS CUTIS   Return for TBSE in 2 years.  I, Lawson Radar, CMA, am acting as scribe for Elie Goody, MD.   Documentation: I have reviewed the above documentation for accuracy and completeness, and I agree with the above.  Elie Goody, MD

## 2023-07-25 ENCOUNTER — Other Ambulatory Visit: Payer: Self-pay

## 2023-07-25 MED ORDER — AMLODIPINE BESYLATE 5 MG PO TABS
5.0000 mg | ORAL_TABLET | Freq: Every day | ORAL | 1 refills | Status: DC
  Filled 2023-07-25: qty 100, 100d supply, fill #0
  Filled 2023-07-30: qty 59, 59d supply, fill #0
  Filled 2023-07-30 (×2): qty 31, 31d supply, fill #0
  Filled 2023-07-30: qty 69, 69d supply, fill #0
  Filled 2023-07-30: qty 31, 31d supply, fill #0
  Filled 2023-11-06: qty 90, 90d supply, fill #1
  Filled 2024-02-10: qty 90, 90d supply, fill #2

## 2023-07-30 ENCOUNTER — Other Ambulatory Visit: Payer: Self-pay

## 2023-09-05 ENCOUNTER — Other Ambulatory Visit: Payer: Self-pay

## 2023-09-05 DIAGNOSIS — R351 Nocturia: Secondary | ICD-10-CM | POA: Diagnosis not present

## 2023-09-05 DIAGNOSIS — R3915 Urgency of urination: Secondary | ICD-10-CM | POA: Diagnosis not present

## 2023-09-05 DIAGNOSIS — N5201 Erectile dysfunction due to arterial insufficiency: Secondary | ICD-10-CM | POA: Diagnosis not present

## 2023-09-05 DIAGNOSIS — N403 Nodular prostate with lower urinary tract symptoms: Secondary | ICD-10-CM | POA: Diagnosis not present

## 2023-09-05 MED ORDER — SILDENAFIL CITRATE 20 MG PO TABS
20.0000 mg | ORAL_TABLET | ORAL | 99 refills | Status: AC | PRN
  Filled 2023-09-05: qty 30, 6d supply, fill #0

## 2023-09-05 MED ORDER — TADALAFIL 5 MG PO TABS
5.0000 mg | ORAL_TABLET | Freq: Every day | ORAL | 3 refills | Status: AC
  Filled 2023-09-05: qty 90, 90d supply, fill #0
  Filled 2024-01-07: qty 50, 50d supply, fill #1
  Filled 2024-01-07: qty 40, 40d supply, fill #1
  Filled 2024-04-10: qty 90, 90d supply, fill #2
  Filled 2024-07-06: qty 90, 90d supply, fill #3
  Filled 2024-07-07 (×2): qty 90, 90d supply, fill #0

## 2023-09-10 ENCOUNTER — Other Ambulatory Visit: Payer: Self-pay

## 2023-09-10 MED ORDER — FLUTICASONE PROPIONATE 50 MCG/ACT NA SUSP
2.0000 | Freq: Every day | NASAL | 1 refills | Status: DC
  Filled 2023-09-10: qty 48, 90d supply, fill #0
  Filled 2023-12-21: qty 48, 90d supply, fill #1

## 2023-09-12 ENCOUNTER — Other Ambulatory Visit: Payer: Self-pay

## 2023-10-04 ENCOUNTER — Other Ambulatory Visit: Payer: Self-pay

## 2023-10-05 ENCOUNTER — Other Ambulatory Visit: Payer: Self-pay

## 2023-10-05 MED ORDER — ROSUVASTATIN CALCIUM 20 MG PO TABS
20.0000 mg | ORAL_TABLET | Freq: Every day | ORAL | 1 refills | Status: DC
  Filled 2023-10-05: qty 90, 90d supply, fill #0
  Filled 2024-01-07: qty 90, 90d supply, fill #1
  Filled 2024-04-09: qty 20, 20d supply, fill #2

## 2023-11-07 ENCOUNTER — Other Ambulatory Visit: Payer: Self-pay

## 2023-11-11 ENCOUNTER — Other Ambulatory Visit: Payer: Self-pay

## 2023-11-12 ENCOUNTER — Other Ambulatory Visit: Payer: Self-pay

## 2023-11-13 ENCOUNTER — Other Ambulatory Visit: Payer: Self-pay

## 2023-11-13 MED ORDER — LOSARTAN POTASSIUM 50 MG PO TABS
50.0000 mg | ORAL_TABLET | Freq: Every day | ORAL | 1 refills | Status: DC
  Filled 2023-11-13: qty 90, 90d supply, fill #0
  Filled 2024-02-10: qty 90, 90d supply, fill #1
  Filled 2024-05-09: qty 90, 90d supply, fill #2

## 2023-12-05 DIAGNOSIS — E7800 Pure hypercholesterolemia, unspecified: Secondary | ICD-10-CM | POA: Diagnosis not present

## 2023-12-12 ENCOUNTER — Other Ambulatory Visit: Payer: Self-pay

## 2023-12-12 DIAGNOSIS — Z6830 Body mass index (BMI) 30.0-30.9, adult: Secondary | ICD-10-CM | POA: Diagnosis not present

## 2023-12-12 DIAGNOSIS — G5702 Lesion of sciatic nerve, left lower limb: Secondary | ICD-10-CM | POA: Diagnosis not present

## 2023-12-12 DIAGNOSIS — E7800 Pure hypercholesterolemia, unspecified: Secondary | ICD-10-CM | POA: Diagnosis not present

## 2023-12-12 DIAGNOSIS — E66811 Obesity, class 1: Secondary | ICD-10-CM | POA: Diagnosis not present

## 2023-12-12 DIAGNOSIS — I10 Essential (primary) hypertension: Secondary | ICD-10-CM | POA: Diagnosis not present

## 2023-12-12 DIAGNOSIS — Z0000 Encounter for general adult medical examination without abnormal findings: Secondary | ICD-10-CM | POA: Diagnosis not present

## 2023-12-12 DIAGNOSIS — Z1331 Encounter for screening for depression: Secondary | ICD-10-CM | POA: Diagnosis not present

## 2023-12-12 DIAGNOSIS — E6609 Other obesity due to excess calories: Secondary | ICD-10-CM | POA: Diagnosis not present

## 2023-12-12 MED ORDER — PREDNISONE 10 MG PO TABS
ORAL_TABLET | ORAL | 0 refills | Status: AC
  Filled 2023-12-12: qty 30, 12d supply, fill #0

## 2023-12-21 ENCOUNTER — Other Ambulatory Visit: Payer: Self-pay

## 2023-12-25 ENCOUNTER — Ambulatory Visit: Attending: Family Medicine

## 2023-12-25 DIAGNOSIS — M5432 Sciatica, left side: Secondary | ICD-10-CM | POA: Insufficient documentation

## 2023-12-26 NOTE — Therapy (Signed)
 [facility] Outpatient Rehabilitation at [facility] [address] [place], [place], [address] Phone: [phone]   Fax:  [phone]  Patient Details  Name: WEYMAN BOGDON MRN: [id number] Date of Birth: 10-21-1955 Referring Provider:  Alla Amis, MD  Encounter Date: 12/25/2023   PT End of Session - 12/26/23 0842     Visit Number 1    Number of Visits 1    PT Start Time 1615    PT Stop Time 1645    PT Time Calculation (min) 30 min          Subjective: Pt here for employee screening. ~4 weeks now, starter having shooting sciatic pain in leg first thing in mornings, no prior history, no inciting event. Pt reports somewhat improved throughout the day, but when fairly active and mobile at work or home will have increased pulling sensations in the left anterior foreleg. Pt denies any N/T. Denies any back pain. PMH remote THA bilat. Pt was started on prednisone  by PCP lat week, favorable response to symptoms overall without complete resolution.   Objective:  -positive slump test left -positive PSLR test left  -DTR 2+/4: ankle jerk bilat, 2+/4: knee jerk bilat  -Notable Left ankle DF weakness (unable to heel walk without foot drop)  -walking on toes WNL -Repeated Extension in Prone x15 (no symptoms response)  -Repeated Extension in standing over counter x15 (no symptoms response)   Assessment:  -clinical exam statistically relevant for lumbar radiculopathy, even more concerning given left L5 motor weakness. Pt recently seen by PCP for annual visit, hence defer all other red flag screening to that exam.   Plan:  -recommended pt avoid full return to exertion until at least 2-4 weeks out from prednisone  effects. -advised to perform lumbar extension 1x15 TID, more if symptoms favorable, less or even DC if symptoms NOT favorable.  -recommended evaluation by neurosurgical for best recommendations, regarding which I have reached out to PCP Dr. Alla at [facility].   8:56 AM, 12/26/23 Peggye JAYSON Linear, PT, DPT Physical Therapist - [facility]  Outpatient Physical Therapy- Main Campus [phone]     [place], PT 12/26/2023, 8:44 AM  [facility] Outpatient Rehabilitation at [facility] [address] [place], [place], [address] Phone: [phone]   Fax:  [phone]

## 2023-12-31 ENCOUNTER — Other Ambulatory Visit: Payer: Self-pay

## 2023-12-31 DIAGNOSIS — R21 Rash and other nonspecific skin eruption: Secondary | ICD-10-CM | POA: Diagnosis not present

## 2023-12-31 DIAGNOSIS — M5432 Sciatica, left side: Secondary | ICD-10-CM | POA: Diagnosis not present

## 2023-12-31 DIAGNOSIS — R29898 Other symptoms and signs involving the musculoskeletal system: Secondary | ICD-10-CM | POA: Diagnosis not present

## 2023-12-31 MED ORDER — TRIAMCINOLONE ACETONIDE 0.1 % EX CREA
TOPICAL_CREAM | Freq: Two times a day (BID) | CUTANEOUS | 0 refills | Status: DC
  Filled 2023-12-31: qty 30, 15d supply, fill #0

## 2023-12-31 NOTE — Progress Notes (Signed)
 Chief Complaint  Patient presents with  . Referral Request    Neurology, sciatica    Patient is agreeable to [employer] scribe.   History of Present Illness [name] is a 68 year old male who presents with left foot weakness following a recent episode of sciatica.  He experienced severe pain during his annual physical in the fall, which was identified as sciatica. A course of prednisone  was prescribed, tapered over ten days, and effectively resolved the pain. 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  Despite the resolution of pain, he has noticed weakness in his left foot, particularly when walking on his heels. He describes difficulty in holding the left heel up, which was identified by a physical therapist. He is concerned about potential 'drop foot' as he has friends with similar issues. No numbness or tingling in the affected area.  His past medical history includes two total hip replacements approximately fifteen years ago. He also mentions a past prostate problem for which he underwent an x-ray, and recalls that the doctor commented on some changes in the lower back, which he believes may be due to age-related arthritis.  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  He reports being bitten by ants while mowing the lawn, resulting in itchy welts. He has been using cortisone cream for relief.    ROS  Review of systems is unremarkable for any active cardiac, respiratory, GI, GU, hematologic, neurologic, dermatologic, HEENT, or psychiatric symptoms except as noted above.  No fevers, chills, or constitutional symptoms.   Current Outpatient Medications  Medication Sig Dispense Refill  . amLODIPine  (NORVASC ) 5 MG tablet  Take 1 tablet (5 mg total) by mouth daily. 100 tablet 1  . aspirin 81 MG EC tablet Take 81 mg by mouth once daily.      . cholecalciferol (VITAMIN D3) 1000 unit tablet Take 1,000 Units by mouth once daily.    . fluticasone  propionate (FLONASE ) 50 mcg/actuation nasal spray Place 2 sprays into both nostrils daily. 48 g 1  . losartan  (COZAAR ) 50 MG tablet Take 1 tablet (50 mg total) by mouth daily. 100 tablet 1  . multivitamin tablet Take 1 tablet by mouth once daily.      . rosuvastatin  (CRESTOR ) 20 MG tablet Take 1 tablet (20 mg total) by mouth daily. 100 tablet 1  . sildenafil  (REVATIO ) 20 mg tablet TAKE 2 TO 5 TABLETS BY MOUTH AS NEEDED FOR ERECTILE DYSFUNCTION 30 tablet 3  . tadalafiL  (CIALIS ) 5 MG tablet Take 5 mg by mouth once daily    . predniSONE  (DELTASONE ) 10 MG tablet 4 tabs po daily x 3 days, 3 tabs po daily x 3 days, 2 tabs po daily x 3 days, 1 tab po daily x 3 days (Patient not taking: Reported on 12/31/2023) 30 tablet 0  . triamcinolone  0.1 % cream Apply topically 2 (two) times daily 30 g 0   No current facility-administered medications for this visit.    Allergies as of 12/31/2023 - Reviewed 12/31/2023  Allergen Reaction Noted  . Morphine  Itching 10/17/2011    Patient Active Problem List  Diagnosis  . Essential hypertension  . Patent foramen ovale (small - asymptomatic) - followed by Dr. Margaretann  . Other male erectile  dysfunction  . Urge incontinence of urine - followed by urology  . Urinary bladder stone (confirmed on MRI 2023) - followed by Dr. Gala  . 10 year risk of MI or stroke 7.5% or greater  . Coronary artery disease involving native coronary artery of native heart without angina pectoris (cardiac CT 11/21/22; CAC 466; 81st percentile) - followed by Dr. Ladona  . Aortic atherosclerosis (CT 11/21/22)  . Pure hypercholesterolemia (LDL 53 - 12/05/23)  . Class 1 obesity due to excess calories with serious comorbidity and body mass index (BMI) of 30.0 to 30.9 in adult     Past Medical History:  Diagnosis Date  . Atrial septal defect (HHS-HCC)    Had TEE  . Gout, joint   . History of gout   . Hypertension     Past Surgical History:  Procedure Laterality Date  . Total hip replacements Right 2008  . REPLACEMENT TOTAL HIP W/  RESURFACING IMPLANTS Left 2010  . HERNIA REPAIR Bilateral 12/2014   Bilateral inguinal hernia repair    Vitals:   12/31/23 0921  BP: 118/86  Pulse: 63  SpO2: 97%  Weight: 95 kg (209 lb 6.4 oz)  Height: 175.3 cm (5' 9)  PainSc: 0-No pain   Body mass index is 30.92 kg/m.  Exam BP 118/86 (BP Location: Left upper arm, Patient Position: Sitting, BP Cuff Size: Adult)   Pulse 63   Ht 175.3 cm (5' 9)   Wt 95 kg (209 lb 6.4 oz)   SpO2 97%   BMI 30.92 kg/m   General. Well appearing; NAD; VS reviewed     Eyes. Sclera and conjunctiva clear; Vision grossly intact; extraocular movements intact Oropharynx. No suspicious lesions Neck. Supple. No swelling, masses, thyroid  normal size, no masses palpated.   Lungs. Respirations unlabored; clear to auscultation bilaterally Cardiovascular. Heart regular rate and rhythm without murmurs, gallops, or rubs Extremities: No swelling in the lower extremities.  1 of 5 dorsiflexion in the left foot all other movements of the foot ankle knee and hip are 5 out of 5. Skin. Normal color and turgor; various insect bites on the arms with mild light erythema.  No pain when palpating the drainage fluctuance or induration. Neurologic. Alert and oriented x3; CN 2-12 grossly intact; no focal deficits  Assessment & Plan Sciatica/weakness in left foot Severe pain resolved with prednisone  taper. Mild left foot dorsiflexion weakness suggests possible pinched nerve or neurological issue. Discussed potential neuropathy or spinal issue. MRI considered best imaging, nerve conduction study may be justified. - Refer to neurology for evaluation and possible nerve conduction study. - Consider MRI if  recommended by neurologist.  Rash Persistent itching and welts from likely ant bites not resolved after three weeks. - Prescribe triamcinolone  cream to apply twice daily to affected areas.    F/U: Follow-up as needed  JASON HESTLE WHITAKER, PA  This note has been created using automated tools and reviewed for accuracy by JASON HESTLE WHITAKER.   Note: This dictation was prepared with Dragon dictation along with smaller phrase technology. Any transcriptional errors that result from this process are unintentional.

## 2024-01-07 ENCOUNTER — Other Ambulatory Visit: Payer: Self-pay

## 2024-01-29 ENCOUNTER — Other Ambulatory Visit: Payer: Self-pay

## 2024-01-29 MED ORDER — FLUTICASONE PROPIONATE 50 MCG/ACT NA SUSP
2.0000 | Freq: Every day | NASAL | 1 refills | Status: AC
  Filled 2024-01-29 – 2024-04-02 (×2): qty 48, 90d supply, fill #0
  Filled 2024-07-11: qty 48, 90d supply, fill #1

## 2024-01-31 DIAGNOSIS — R531 Weakness: Secondary | ICD-10-CM | POA: Diagnosis not present

## 2024-01-31 DIAGNOSIS — M549 Dorsalgia, unspecified: Secondary | ICD-10-CM | POA: Diagnosis not present

## 2024-01-31 DIAGNOSIS — M5430 Sciatica, unspecified side: Secondary | ICD-10-CM | POA: Diagnosis not present

## 2024-02-04 ENCOUNTER — Other Ambulatory Visit: Payer: Self-pay

## 2024-02-04 DIAGNOSIS — H2513 Age-related nuclear cataract, bilateral: Secondary | ICD-10-CM | POA: Diagnosis not present

## 2024-02-04 DIAGNOSIS — M3501 Sicca syndrome with keratoconjunctivitis: Secondary | ICD-10-CM | POA: Diagnosis not present

## 2024-02-04 DIAGNOSIS — H43813 Vitreous degeneration, bilateral: Secondary | ICD-10-CM | POA: Diagnosis not present

## 2024-02-04 MED ORDER — AMOXICILLIN 500 MG PO CAPS
2000.0000 mg | ORAL_CAPSULE | ORAL | 0 refills | Status: AC
  Filled 2024-02-04: qty 8, 2d supply, fill #0

## 2024-02-07 ENCOUNTER — Other Ambulatory Visit: Payer: Self-pay

## 2024-02-10 ENCOUNTER — Other Ambulatory Visit: Payer: Self-pay

## 2024-02-11 ENCOUNTER — Other Ambulatory Visit: Payer: Self-pay

## 2024-02-11 MED ORDER — AMLODIPINE BESYLATE 5 MG PO TABS
5.0000 mg | ORAL_TABLET | Freq: Every day | ORAL | 1 refills | Status: AC
  Filled 2024-02-11: qty 90, 90d supply, fill #0
  Filled 2024-05-09: qty 90, 90d supply, fill #1

## 2024-04-02 ENCOUNTER — Other Ambulatory Visit: Payer: Self-pay

## 2024-04-04 ENCOUNTER — Other Ambulatory Visit: Payer: Self-pay

## 2024-04-04 MED ORDER — ZOSTER VAC RECOMB ADJUVANTED 50 MCG/0.5ML IM SUSR
0.5000 mL | Freq: Once | INTRAMUSCULAR | 1 refills | Status: AC
  Filled 2024-04-04: qty 0.5, 1d supply, fill #0

## 2024-04-09 ENCOUNTER — Other Ambulatory Visit: Payer: Self-pay

## 2024-04-10 ENCOUNTER — Other Ambulatory Visit: Payer: Self-pay

## 2024-04-29 ENCOUNTER — Other Ambulatory Visit: Payer: Self-pay

## 2024-04-30 ENCOUNTER — Other Ambulatory Visit: Payer: Self-pay

## 2024-04-30 MED ORDER — ROSUVASTATIN CALCIUM 20 MG PO TABS
20.0000 mg | ORAL_TABLET | Freq: Every day | ORAL | 1 refills | Status: AC
  Filled 2024-04-30: qty 90, 90d supply, fill #0
  Filled 2024-07-26: qty 90, 90d supply, fill #1
  Filled 2024-07-26: qty 90, 90d supply, fill #0

## 2024-05-09 ENCOUNTER — Other Ambulatory Visit: Payer: Self-pay

## 2024-05-09 MED ORDER — LOSARTAN POTASSIUM 50 MG PO TABS
50.0000 mg | ORAL_TABLET | Freq: Every day | ORAL | 1 refills | Status: AC
  Filled 2024-05-09: qty 90, 90d supply, fill #0

## 2024-05-29 ENCOUNTER — Other Ambulatory Visit: Payer: Self-pay | Admitting: *Deleted

## 2024-05-29 DIAGNOSIS — I77810 Thoracic aortic ectasia: Secondary | ICD-10-CM

## 2024-05-29 DIAGNOSIS — I351 Nonrheumatic aortic (valve) insufficiency: Secondary | ICD-10-CM

## 2024-06-10 DIAGNOSIS — I10 Essential (primary) hypertension: Secondary | ICD-10-CM | POA: Diagnosis not present

## 2024-06-10 DIAGNOSIS — Z125 Encounter for screening for malignant neoplasm of prostate: Secondary | ICD-10-CM | POA: Diagnosis not present

## 2024-06-10 DIAGNOSIS — E7800 Pure hypercholesterolemia, unspecified: Secondary | ICD-10-CM | POA: Diagnosis not present

## 2024-06-17 DIAGNOSIS — I10 Essential (primary) hypertension: Secondary | ICD-10-CM | POA: Diagnosis not present

## 2024-06-17 DIAGNOSIS — E7800 Pure hypercholesterolemia, unspecified: Secondary | ICD-10-CM | POA: Diagnosis not present

## 2024-06-17 DIAGNOSIS — E6609 Other obesity due to excess calories: Secondary | ICD-10-CM | POA: Diagnosis not present

## 2024-06-17 DIAGNOSIS — D696 Thrombocytopenia, unspecified: Secondary | ICD-10-CM | POA: Diagnosis not present

## 2024-06-17 DIAGNOSIS — Z6832 Body mass index (BMI) 32.0-32.9, adult: Secondary | ICD-10-CM | POA: Diagnosis not present

## 2024-06-17 DIAGNOSIS — E66811 Obesity, class 1: Secondary | ICD-10-CM | POA: Diagnosis not present

## 2024-06-20 ENCOUNTER — Other Ambulatory Visit: Payer: Self-pay

## 2024-06-20 MED ORDER — OSELTAMIVIR PHOSPHATE 75 MG PO CAPS
75.0000 mg | ORAL_CAPSULE | Freq: Two times a day (BID) | ORAL | 0 refills | Status: AC
  Filled 2024-06-20: qty 10, 5d supply, fill #0

## 2024-06-27 ENCOUNTER — Other Ambulatory Visit: Payer: Self-pay

## 2024-07-07 ENCOUNTER — Other Ambulatory Visit (HOSPITAL_COMMUNITY): Payer: Self-pay

## 2024-07-07 ENCOUNTER — Other Ambulatory Visit: Payer: Self-pay

## 2024-07-11 ENCOUNTER — Other Ambulatory Visit: Payer: Self-pay

## 2024-07-15 ENCOUNTER — Other Ambulatory Visit: Payer: Self-pay

## 2024-07-26 ENCOUNTER — Other Ambulatory Visit (HOSPITAL_COMMUNITY): Payer: Self-pay

## 2024-07-26 ENCOUNTER — Other Ambulatory Visit: Payer: Self-pay

## 2024-07-28 ENCOUNTER — Other Ambulatory Visit: Payer: Self-pay

## 2025-07-16 ENCOUNTER — Ambulatory Visit: Payer: Commercial Managed Care - PPO | Admitting: Dermatology
